# Patient Record
Sex: Male | Born: 1950 | Race: Black or African American | Hispanic: No | Marital: Married
Health system: Southern US, Community
[De-identification: ages and names within clinical notes are randomized; demographics above are authoritative.]

---

## 2013-07-30 ENCOUNTER — Inpatient Hospital Stay
Admission: AD | Admit: 2013-07-30 | Discharge: 2013-08-20 | Disposition: A | Discharge status: Skilled Nursing Facility | Payer: Self-pay | Source: Ambulatory Visit | Attending: Internal Medicine | Admitting: Internal Medicine

## 2013-07-31 ENCOUNTER — Other Ambulatory Visit (HOSPITAL_COMMUNITY): Payer: Self-pay

## 2013-07-31 LAB — COMPREHENSIVE METABOLIC PANEL
ALT: 19 U/L (ref 0–53)
Calcium: 9.4 mg/dL (ref 8.4–10.5)
GFR calc Af Amer: >90 mL/min (ref >90)
Glucose, Bld: 129 mg/dL — ABNORMAL HIGH (ref 70–99)
Sodium: 138 mEq/L (ref 135–145)
Total Protein: 6.1 g/dL (ref 6.0–8.3)

## 2013-07-31 LAB — HEMOGLOBIN A1C: Hgb A1c MFr Bld: 6.8 % — ABNORMAL HIGH (ref <5.7)

## 2013-07-31 LAB — PROCALCITONIN: Procalcitonin: 0.16 ng/mL

## 2013-07-31 LAB — PRO B NATRIURETIC PEPTIDE: Pro B Natriuretic peptide (BNP): 56.9 pg/mL (ref 0–125)

## 2013-08-02 LAB — CBC WITH DIFFERENTIAL/PLATELET
Hemoglobin: 12 g/dL — ABNORMAL LOW (ref 13.0–17.0)
Lymphocytes Relative: 4 % — ABNORMAL LOW (ref 12–46)
Lymphs Abs: 0.5 10*3/uL — ABNORMAL LOW (ref 0.7–4.0)
MCH: 30.3 pg (ref 26.0–34.0)
Monocytes Relative: 6 % (ref 3–12)
Neutro Abs: 10.4 10*3/uL — ABNORMAL HIGH (ref 1.7–7.7)
Neutrophils Relative %: 90 % — ABNORMAL HIGH (ref 43–77)
RBC: 3.96 MIL/uL — ABNORMAL LOW (ref 4.22–5.81)
WBC: 11.6 10*3/uL — ABNORMAL HIGH (ref 4.0–10.5)

## 2013-08-02 LAB — BASIC METABOLIC PANEL
BUN: 28 mg/dL — ABNORMAL HIGH (ref 6–23)
CO2: 32 mEq/L (ref 19–32)
Chloride: 97 mEq/L (ref 96–112)
Glucose, Bld: 151 mg/dL — ABNORMAL HIGH (ref 70–99)
Potassium: 4.6 mEq/L (ref 3.5–5.1)

## 2013-08-02 LAB — T4, FREE: Free T4: 1.2 ng/dL (ref 0.80–1.80)

## 2013-08-03 ENCOUNTER — Other Ambulatory Visit (HOSPITAL_COMMUNITY): Payer: Self-pay

## 2013-08-04 ENCOUNTER — Other Ambulatory Visit (HOSPITAL_COMMUNITY): Payer: Self-pay

## 2013-08-04 LAB — CBC WITH DIFFERENTIAL/PLATELET
Basophils Absolute: 0 10*3/uL (ref 0.0–0.1)
Basophils Relative: 0 % (ref 0–1)
Eosinophils Absolute: 0 10*3/uL (ref 0.0–0.7)
Eosinophils Absolute: 0 10*3/uL (ref 0.0–0.7)
Eosinophils Relative: 0 % (ref 0–5)
Hemoglobin: 11.5 g/dL — ABNORMAL LOW (ref 13.0–17.0)
Hemoglobin: 13 g/dL (ref 13.0–17.0)
Lymphocytes Relative: 6 % — ABNORMAL LOW (ref 12–46)
Lymphocytes Relative: 7 % — ABNORMAL LOW (ref 12–46)
Lymphs Abs: 0.7 10*3/uL (ref 0.7–4.0)
MCH: 30.1 pg (ref 26.0–34.0)
MCH: 31.1 pg (ref 26.0–34.0)
MCHC: 34.6 g/dL (ref 30.0–36.0)
MCV: 90.1 fL (ref 78.0–100.0)
Monocytes Absolute: 0.9 10*3/uL (ref 0.1–1.0)
Monocytes Relative: 4 % (ref 3–12)
Neutrophils Relative %: 87 % — ABNORMAL HIGH (ref 43–77)
Platelets: 182 10*3/uL (ref 150–400)
RBC: 3.82 MIL/uL — ABNORMAL LOW (ref 4.22–5.81)
RDW: 15.1 % (ref 11.5–15.5)
WBC: 11.7 10*3/uL — ABNORMAL HIGH (ref 4.0–10.5)

## 2013-08-04 LAB — COMPREHENSIVE METABOLIC PANEL
ALT: 27 U/L (ref 0–53)
Alkaline Phosphatase: 64 U/L (ref 39–117)
BUN: 27 mg/dL — ABNORMAL HIGH (ref 6–23)
CO2: 31 mEq/L (ref 19–32)
Calcium: 9.2 mg/dL (ref 8.4–10.5)
GFR calc Af Amer: >90 mL/min (ref >90)
GFR calc non Af Amer: >90 mL/min (ref >90)
Glucose, Bld: 144 mg/dL — ABNORMAL HIGH (ref 70–99)
Potassium: 5.1 mEq/L (ref 3.5–5.1)
Total Protein: 5.5 g/dL — ABNORMAL LOW (ref 6.0–8.3)

## 2013-08-04 LAB — PREALBUMIN: Prealbumin: 31.3 mg/dL (ref 17.0–34.0)

## 2013-08-05 LAB — CBC WITH DIFFERENTIAL/PLATELET
Basophils Absolute: 0 10*3/uL (ref 0.0–0.1)
Basophils Relative: 0 % (ref 0–1)
Eosinophils Absolute: 0 10*3/uL (ref 0.0–0.7)
Hemoglobin: 10.7 g/dL — ABNORMAL LOW (ref 13.0–17.0)
Lymphocytes Relative: 6 % — ABNORMAL LOW (ref 12–46)
MCH: 29.8 pg (ref 26.0–34.0)
MCHC: 33.1 g/dL (ref 30.0–36.0)
Monocytes Absolute: 0.7 10*3/uL (ref 0.1–1.0)
Neutrophils Relative %: 88 % — ABNORMAL HIGH (ref 43–77)
Platelets: 142 10*3/uL — ABNORMAL LOW (ref 150–400)
RDW: 14.9 % (ref 11.5–15.5)

## 2013-08-05 LAB — PROCALCITONIN: Procalcitonin: <0.1 ng/mL

## 2013-08-06 ENCOUNTER — Other Ambulatory Visit (HOSPITAL_COMMUNITY): Payer: Self-pay

## 2013-08-06 LAB — BLOOD GAS, ARTERIAL
Bicarbonate: 32.2 mEq/L — ABNORMAL HIGH (ref 20.0–24.0)
FIO2: 0.32 %
O2 Saturation: 96 %
Patient temperature: 98.6

## 2013-08-07 ENCOUNTER — Other Ambulatory Visit (HOSPITAL_COMMUNITY): Payer: Self-pay

## 2013-08-07 LAB — CBC WITH DIFFERENTIAL/PLATELET
Basophils Relative: 0 % (ref 0–1)
Eosinophils Absolute: 0 10*3/uL (ref 0.0–0.7)
Hemoglobin: 11.8 g/dL — ABNORMAL LOW (ref 13.0–17.0)
MCH: 29.7 pg (ref 26.0–34.0)
MCHC: 32.9 g/dL (ref 30.0–36.0)
Monocytes Absolute: 0.4 10*3/uL (ref 0.1–1.0)
Neutro Abs: 12.4 10*3/uL — ABNORMAL HIGH (ref 1.7–7.7)
Neutrophils Relative %: 92 % — ABNORMAL HIGH (ref 43–77)

## 2013-08-07 LAB — BASIC METABOLIC PANEL
BUN: 23 mg/dL (ref 6–23)
CO2: 33 mEq/L — ABNORMAL HIGH (ref 19–32)
Calcium: 9.1 mg/dL (ref 8.4–10.5)
Creatinine, Ser: 0.67 mg/dL (ref 0.50–1.35)
Glucose, Bld: 140 mg/dL — ABNORMAL HIGH (ref 70–99)

## 2013-08-08 ENCOUNTER — Other Ambulatory Visit (HOSPITAL_COMMUNITY): Payer: Self-pay

## 2013-08-08 LAB — BASIC METABOLIC PANEL
Calcium: 9 mg/dL (ref 8.4–10.5)
Calcium: 9.2 mg/dL (ref 8.4–10.5)
Creatinine, Ser: 0.71 mg/dL (ref 0.50–1.35)
Creatinine, Ser: 0.73 mg/dL (ref 0.50–1.35)
GFR calc Af Amer: >90 mL/min (ref >90)
GFR calc non Af Amer: >90 mL/min (ref >90)
Sodium: 133 mEq/L — ABNORMAL LOW (ref 135–145)

## 2013-08-08 LAB — CBC
MCH: 29.6 pg (ref 26.0–34.0)
MCHC: 32.7 g/dL (ref 30.0–36.0)
MCV: 90.6 fL (ref 78.0–100.0)
Platelets: 142 10*3/uL — ABNORMAL LOW (ref 150–400)
RDW: 15.1 % (ref 11.5–15.5)

## 2013-08-08 NOTE — H&P (Signed)
Lucas Glover is an 62 y.o. male.   Chief Complaint: Recent aspiration Malnutrition; dysphagia FTT; dementia Scheduled for percutaneous gastric tube placement Long term care Existing NG HPI: COPD- on steroids; Sz; debilitation  No past medical history on file.  No past surgical history on file.  No family history on file. Social History:  has no tobacco, alcohol, and drug history on file.  Allergies: Allergies not on file  No prescriptions prior to admission    Results for orders placed during the hospital encounter of 07/30/13 (from the past 48 hour(s))  BLOOD GAS, ARTERIAL     Status: Abnormal   Collection Time    08/06/13  4:20 PM      Result Value Range   FIO2 0.32     Delivery systems NASAL CANNULA     pH, Arterial 7.462 (*) 7.350 - 7.450   pCO2 arterial 45.7 (*) 35.0 - 45.0 mmHg   pO2, Arterial 74.1 (*) 80.0 - 100.0 mmHg   Bicarbonate 32.2 (*) 20.0 - 24.0 mEq/L   TCO2 33.6  0 - 100 mmol/L   Acid-Base Excess 8.1 (*) 0.0 - 2.0 mmol/L   O2 Saturation 96.0     Patient temperature 98.6     Collection site RIGHT RADIAL     Drawn by COLLECTED BY RT     Sample type ARTERIAL DRAW     Allens test (pass/fail) PASS  PASS  CBC WITH DIFFERENTIAL     Status: Abnormal   Collection Time    08/07/13  5:00 AM      Result Value Range   WBC 13.5 (*) 4.0 - 10.5 K/uL   RBC 3.97 (*) 4.22 - 5.81 MIL/uL   Hemoglobin 11.8 (*) 13.0 - 17.0 g/dL   HCT 16.1 (*) 09.6 - 04.5 %   MCV 90.4  78.0 - 100.0 fL   MCH 29.7  26.0 - 34.0 pg   MCHC 32.9  30.0 - 36.0 g/dL   RDW 40.9  81.1 - 91.4 %   Platelets 141 (*) 150 - 400 K/uL   Neutrophils Relative % 92 (*) 43 - 77 %   Lymphocytes Relative 5 (*) 12 - 46 %   Monocytes Relative 3  3 - 12 %   Eosinophils Relative 0  0 - 5 %   Basophils Relative 0  0 - 1 %   Neutro Abs 12.4 (*) 1.7 - 7.7 K/uL   Lymphs Abs 0.7  0.7 - 4.0 K/uL   Monocytes Absolute 0.4  0.1 - 1.0 K/uL   Eosinophils Absolute 0.0  0.0 - 0.7 K/uL   Basophils Absolute 0.0  0.0 - 0.1  K/uL   RBC Morphology RARE NRBCs     WBC Morphology MILD LEFT SHIFT (1-5% METAS, OCC MYELO, OCC BANDS)     Comment: TOXIC GRANULATION  BASIC METABOLIC PANEL     Status: Abnormal   Collection Time    08/07/13  5:00 AM      Result Value Range   Sodium 133 (*) 135 - 145 mEq/L   Potassium 4.8  3.5 - 5.1 mEq/L   Chloride 91 (*) 96 - 112 mEq/L   CO2 33 (*) 19 - 32 mEq/L   Glucose, Bld 140 (*) 70 - 99 mg/dL   BUN 23  6 - 23 mg/dL   Creatinine, Ser 7.82  0.50 - 1.35 mg/dL   Calcium 9.1  8.4 - 95.6 mg/dL   GFR calc non Af Amer >90  >90 mL/min   GFR  calc Af Amer >90  >90 mL/min   Comment: (NOTE)     The eGFR has been calculated using the CKD EPI equation.     This calculation has not been validated in all clinical situations.     eGFR's persistently <90 mL/min signify possible Chronic Kidney     Disease.  CBC     Status: Abnormal   Collection Time    08/08/13  5:26 AM      Result Value Range   WBC 13.3 (*) 4.0 - 10.5 K/uL   RBC 4.05 (*) 4.22 - 5.81 MIL/uL   Hemoglobin 12.0 (*) 13.0 - 17.0 g/dL   HCT 21.3 (*) 08.6 - 57.8 %   MCV 90.6  78.0 - 100.0 fL   MCH 29.6  26.0 - 34.0 pg   MCHC 32.7  30.0 - 36.0 g/dL   RDW 46.9  62.9 - 52.8 %   Platelets 142 (*) 150 - 400 K/uL  BASIC METABOLIC PANEL     Status: Abnormal   Collection Time    08/08/13  5:26 AM      Result Value Range   Sodium 132 (*) 135 - 145 mEq/L   Potassium 5.0  3.5 - 5.1 mEq/L   Chloride 91 (*) 96 - 112 mEq/L   CO2 33 (*) 19 - 32 mEq/L   Glucose, Bld 202 (*) 70 - 99 mg/dL   BUN 24 (*) 6 - 23 mg/dL   Creatinine, Ser 4.13  0.50 - 1.35 mg/dL   Calcium 9.0  8.4 - 24.4 mg/dL   GFR calc non Af Amer >90  >90 mL/min   GFR calc Af Amer >90  >90 mL/min   Comment: (NOTE)     The eGFR has been calculated using the CKD EPI equation.     This calculation has not been validated in all clinical situations.     eGFR's persistently <90 mL/min signify possible Chronic Kidney     Disease.   Dg Chest Port 1 View  08/07/2013    *RADIOLOGY REPORT*  Clinical Data: Respiratory failure, aspiration, pneumonia  PORTABLE CHEST - 1 VIEW  Comparison: 08/04/2013  Findings: Cardiomediastinal silhouette is stable.  NG tube in place is noted.  No segmental infiltrate or pulmonary edema.  Persistent streaky opacities in right upper lobe.  No pleural effusion.  IMPRESSION: No segmental infiltrate or pulmonary edema.  Persistent streaky opacities in the right upper lobe. NG tube in place.   Original Report Authenticated By: Natasha Mead, M.D.   Dg Chest Port 1 View  08/06/2013   *RADIOLOGY REPORT*  Clinical Data: Aspiration, evaluate NG tube placement  PORTABLE CHEST - 1 VIEW  Comparison: Chest radiograph 08/04/2013  Findings: Interval placement NG tube which extends into the gastric body.  Lungs are clear.  IMPRESSION: Interval placement NG tube in the stomach.   Original Report Authenticated By: Genevive Bi, M.D.   Dg Abd Portable 1v  08/06/2013   *RADIOLOGY REPORT*  Clinical Data: Panda placement  PORTABLE ABDOMEN - 1 VIEW  Comparison: Earlier film of the same day  Findings: Nasogastric tube extends to the gastric antrum as before. Normal bowel gas pattern.  Surgical clips in the left lower abdomen as before.  IMPRESSION:  Nasogastric tube to the gastric antrum.   Original Report Authenticated By: D. Andria Rhein, MD   Dg Abd Portable 1v  08/06/2013   *RADIOLOGY REPORT*  Clinical Data: Nasogastric tube placement.  PORTABLE ABDOMEN - 1 VIEW  Comparison: Chest radiographs same day.  Findings: 1627 hours.  Nasogastric tube projects into the distal stomach or proximal duodenum.  There is a small right upper quadrant calcification which may reflect a renal calculus or gallstone.  Scattered vascular calcifications and surgical clips are noted.  The bowel gas pattern appears normal.  IMPRESSION: Nasogastric tube tip in the right upper quadrant of the abdomen, likely in the distal stomach or proximal duodenum.  Possible gallstone or right kidney  stone.   Original Report Authenticated By: Carey Bullocks, M.D.    Review of Systems  Constitutional: Positive for weight loss. Negative for fever.  Respiratory: Positive for shortness of breath.   Cardiovascular: Negative for chest pain.  Gastrointestinal: Negative for nausea, vomiting and abdominal pain.  Neurological: Positive for weakness.  Psychiatric/Behavioral: Positive for memory loss.    There were no vitals taken for this visit. Physical Exam  Constitutional:  thin  Cardiovascular: Normal rate and regular rhythm.   Murmur heard. Respiratory: Effort normal. He has wheezes.  GI: Soft. Bowel sounds are normal. There is no tenderness.  Musculoskeletal: Normal range of motion.  FROM; weak  Neurological: He is alert.  Minimally confused  Skin: Skin is warm and dry.  Psychiatric:  Will consent family     Assessment/Plan Dysphagia; FTT; aspiration Scheduled for G tube 8/18 Wbc sl high - on steroids T: 99 Vanco ordered for prior to G tube Check kub and temp Attempted to consent with wife and dtr- NA Will try over weekend  [facility] A 08/08/2013, 2:56 PM

## 2013-08-09 LAB — BASIC METABOLIC PANEL
BUN: 26 mg/dL — ABNORMAL HIGH (ref 6–23)
CO2: 30 mEq/L (ref 19–32)
Calcium: 9 mg/dL (ref 8.4–10.5)
Calcium: 9 mg/dL (ref 8.4–10.5)
Creatinine, Ser: 0.73 mg/dL (ref 0.50–1.35)
GFR calc Af Amer: >90 mL/min (ref >90)
GFR calc non Af Amer: >90 mL/min (ref >90)
Glucose, Bld: 138 mg/dL — ABNORMAL HIGH (ref 70–99)
Potassium: 4.8 mEq/L (ref 3.5–5.1)
Sodium: 130 mEq/L — ABNORMAL LOW (ref 135–145)

## 2013-08-09 LAB — CBC
HCT: 36.3 % — ABNORMAL LOW (ref 39.0–52.0)
Hemoglobin: 12.5 g/dL — ABNORMAL LOW (ref 13.0–17.0)
MCH: 30.7 pg (ref 26.0–34.0)
MCV: 89.2 fL (ref 78.0–100.0)
RBC: 4.07 MIL/uL — ABNORMAL LOW (ref 4.22–5.81)

## 2013-08-10 LAB — BASIC METABOLIC PANEL
BUN: 25 mg/dL — ABNORMAL HIGH (ref 6–23)
Calcium: 9.3 mg/dL (ref 8.4–10.5)
GFR calc non Af Amer: >90 mL/min (ref >90)
Glucose, Bld: 132 mg/dL — ABNORMAL HIGH (ref 70–99)

## 2013-08-10 NOTE — Progress Notes (Signed)
Patient ID: Lucas Glover, male   DOB: 1951-06-23, 62 y.o.   MRN: [id number]   Consented wife via phone Consent in chart afeb Check kub in am Lovenox held

## 2013-08-11 ENCOUNTER — Other Ambulatory Visit (HOSPITAL_COMMUNITY): Payer: Self-pay

## 2013-08-11 LAB — CBC
Hemoglobin: 12.3 g/dL — ABNORMAL LOW (ref 13.0–17.0)
MCH: 30.1 pg (ref 26.0–34.0)
MCHC: 33.8 g/dL (ref 30.0–36.0)
MCV: 89 fL (ref 78.0–100.0)
Platelets: 151 10*3/uL (ref 150–400)

## 2013-08-11 LAB — BASIC METABOLIC PANEL
Calcium: 9.1 mg/dL (ref 8.4–10.5)
Creatinine, Ser: 0.79 mg/dL (ref 0.50–1.35)
GFR calc non Af Amer: >90 mL/min (ref >90)
Glucose, Bld: 142 mg/dL — ABNORMAL HIGH (ref 70–99)
Sodium: 130 mEq/L — ABNORMAL LOW (ref 135–145)

## 2013-08-11 MED ORDER — IOHEXOL 300 MG/ML  SOLN
50.0000 mL | Freq: Once | INTRAMUSCULAR | Status: AC | PRN
  Administered 2013-08-11: 20 mL via INTRAVENOUS

## 2013-08-11 MED ORDER — MIDAZOLAM HCL 2 MG/2ML IJ SOLN
INTRAMUSCULAR | Status: AC | PRN
  Administered 2013-08-11 (×3): 1 mg via INTRAVENOUS

## 2013-08-11 MED ORDER — FENTANYL CITRATE 0.05 MG/ML IJ SOLN
INTRAMUSCULAR | Status: AC | PRN
  Administered 2013-08-11 (×3): 50 ug via INTRAVENOUS

## 2013-08-11 NOTE — Procedures (Signed)
20 Fr Pull through Gastrostomy No comp

## 2013-08-11 NOTE — ED Notes (Signed)
Ancef 2g IV given

## 2013-08-12 LAB — BASIC METABOLIC PANEL
CO2: 28 mEq/L (ref 19–32)
Calcium: 9 mg/dL (ref 8.4–10.5)
Chloride: 95 mEq/L — ABNORMAL LOW (ref 96–112)
Creatinine, Ser: 0.78 mg/dL (ref 0.50–1.35)
GFR calc Af Amer: >90 mL/min (ref >90)
GFR calc non Af Amer: >90 mL/min (ref >90)
Glucose, Bld: 193 mg/dL — ABNORMAL HIGH (ref 70–99)
Sodium: 133 mEq/L — ABNORMAL LOW (ref 135–145)

## 2013-08-12 LAB — CBC
MCH: 30.7 pg (ref 26.0–34.0)
MCHC: 34.4 g/dL (ref 30.0–36.0)
Platelets: 153 10*3/uL (ref 150–400)
RBC: 3.74 MIL/uL — ABNORMAL LOW (ref 4.22–5.81)
RDW: 15.2 % (ref 11.5–15.5)

## 2013-08-13 LAB — BASIC METABOLIC PANEL
CO2: 28 mEq/L (ref 19–32)
Calcium: 8.8 mg/dL (ref 8.4–10.5)
Chloride: 96 mEq/L (ref 96–112)
Glucose, Bld: 192 mg/dL — ABNORMAL HIGH (ref 70–99)
Potassium: 4.5 mEq/L (ref 3.5–5.1)
Sodium: 134 mEq/L — ABNORMAL LOW (ref 135–145)

## 2013-08-13 LAB — URINALYSIS, ROUTINE W REFLEX MICROSCOPIC
Glucose, UA: NEGATIVE mg/dL
Hgb urine dipstick: NEGATIVE
Leukocytes, UA: NEGATIVE
Specific Gravity, Urine: 1.023 (ref 1.005–1.030)
pH: 5 (ref 5.0–8.0)

## 2013-08-13 LAB — CBC
Platelets: 155 10*3/uL (ref 150–400)
RBC: 3.53 MIL/uL — ABNORMAL LOW (ref 4.22–5.81)
RDW: 15.2 % (ref 11.5–15.5)
WBC: 11 10*3/uL — ABNORMAL HIGH (ref 4.0–10.5)

## 2013-08-14 ENCOUNTER — Other Ambulatory Visit (HOSPITAL_COMMUNITY): Payer: Self-pay

## 2013-08-14 LAB — URINE CULTURE
Colony Count: NO GROWTH
Culture: NO GROWTH

## 2013-08-14 LAB — BASIC METABOLIC PANEL
CO2: 31 mEq/L (ref 19–32)
Calcium: 8.9 mg/dL (ref 8.4–10.5)
Creatinine, Ser: 0.63 mg/dL (ref 0.50–1.35)
GFR calc non Af Amer: >90 mL/min (ref >90)
Glucose, Bld: 177 mg/dL — ABNORMAL HIGH (ref 70–99)
Sodium: 132 mEq/L — ABNORMAL LOW (ref 135–145)

## 2013-08-14 LAB — CBC
MCH: 30 pg (ref 26.0–34.0)
MCHC: 33.8 g/dL (ref 30.0–36.0)
MCV: 88.9 fL (ref 78.0–100.0)
Platelets: 154 10*3/uL (ref 150–400)

## 2013-08-15 LAB — BASIC METABOLIC PANEL
BUN: 20 mg/dL (ref 6–23)
Calcium: 8.8 mg/dL (ref 8.4–10.5)
GFR calc Af Amer: >90 mL/min (ref >90)
GFR calc non Af Amer: >90 mL/min (ref >90)
Glucose, Bld: 132 mg/dL — ABNORMAL HIGH (ref 70–99)
Potassium: 4.4 mEq/L (ref 3.5–5.1)
Sodium: 133 mEq/L — ABNORMAL LOW (ref 135–145)

## 2013-08-16 LAB — BASIC METABOLIC PANEL
Calcium: 8.8 mg/dL (ref 8.4–10.5)
GFR calc Af Amer: >90 mL/min (ref >90)
GFR calc non Af Amer: >90 mL/min (ref >90)
Glucose, Bld: 197 mg/dL — ABNORMAL HIGH (ref 70–99)
Potassium: 4.4 mEq/L (ref 3.5–5.1)
Sodium: 132 mEq/L — ABNORMAL LOW (ref 135–145)

## 2013-08-16 LAB — CBC
Hemoglobin: 10.8 g/dL — ABNORMAL LOW (ref 13.0–17.0)
MCH: 31.8 pg (ref 26.0–34.0)
MCHC: 35 g/dL (ref 30.0–36.0)
Platelets: 165 10*3/uL (ref 150–400)

## 2013-08-18 ENCOUNTER — Other Ambulatory Visit (HOSPITAL_COMMUNITY): Payer: Self-pay

## 2013-08-18 LAB — COMPREHENSIVE METABOLIC PANEL
ALT: 18 U/L (ref 0–53)
AST: 12 U/L (ref 0–37)
Alkaline Phosphatase: 63 U/L (ref 39–117)
CO2: 32 mEq/L (ref 19–32)
Calcium: 9.1 mg/dL (ref 8.4–10.5)
Chloride: 92 mEq/L — ABNORMAL LOW (ref 96–112)
GFR calc non Af Amer: >90 mL/min (ref >90)
Glucose, Bld: 107 mg/dL — ABNORMAL HIGH (ref 70–99)
Potassium: 4.3 mEq/L (ref 3.5–5.1)
Sodium: 131 mEq/L — ABNORMAL LOW (ref 135–145)

## 2013-08-18 LAB — CBC WITH DIFFERENTIAL/PLATELET
Basophils Absolute: 0 10*3/uL (ref 0.0–0.1)
Basophils Relative: 0 % (ref 0–1)
Eosinophils Relative: 0 % (ref 0–5)
Hemoglobin: 11.1 g/dL — ABNORMAL LOW (ref 13.0–17.0)
Lymphocytes Relative: 11 % — ABNORMAL LOW (ref 12–46)
Monocytes Absolute: 0.4 10*3/uL (ref 0.1–1.0)
Monocytes Relative: 4 % (ref 3–12)
Neutrophils Relative %: 85 % — ABNORMAL HIGH (ref 43–77)
Platelets: 164 10*3/uL (ref 150–400)
RBC: 3.64 MIL/uL — ABNORMAL LOW (ref 4.22–5.81)
WBC: 10.1 10*3/uL (ref 4.0–10.5)

## 2013-08-20 LAB — BASIC METABOLIC PANEL
BUN: 21 mg/dL (ref 6–23)
Creatinine, Ser: 0.59 mg/dL (ref 0.50–1.35)
GFR calc non Af Amer: >90 mL/min (ref >90)
Glucose, Bld: 121 mg/dL — ABNORMAL HIGH (ref 70–99)
Potassium: 4.2 mEq/L (ref 3.5–5.1)

## 2014-04-25 ENCOUNTER — Inpatient Hospital Stay
Admission: EM | Admit: 2014-04-25 | Discharge: 2014-05-25 | Disposition: A | Discharge status: Skilled Nursing Facility | Payer: Medicare Other | Source: Other Acute Inpatient Hospital | Attending: Internal Medicine | Admitting: Internal Medicine

## 2014-04-25 ENCOUNTER — Other Ambulatory Visit (HOSPITAL_COMMUNITY): Payer: Medicare Other

## 2014-04-25 DIAGNOSIS — J189 Pneumonia, unspecified organism: Secondary | ICD-10-CM

## 2014-04-25 DIAGNOSIS — Z930 Tracheostomy status: Secondary | ICD-10-CM

## 2014-04-25 DIAGNOSIS — I5033 Acute on chronic diastolic (congestive) heart failure: Secondary | ICD-10-CM

## 2014-04-25 DIAGNOSIS — J9620 Acute and chronic respiratory failure, unspecified whether with hypoxia or hypercapnia: Secondary | ICD-10-CM

## 2014-04-25 LAB — BLOOD GAS, ARTERIAL
Acid-Base Excess: 8.8 mmol/L — ABNORMAL HIGH (ref 0.0–2.0)
Bicarbonate: 33.1 mEq/L — ABNORMAL HIGH (ref 20.0–24.0)
FIO2: 0.3 %
LHR: 20 {breaths}/min
O2 Saturation: 98.1 %
PATIENT TEMPERATURE: 97.1
PEEP/CPAP: 5 cmH2O
PH ART: 7.46 — AB (ref 7.350–7.450)
TCO2: 34.6 mmol/L (ref 0–100)
VT: 450 mL
pCO2 arterial: 46.7 mmHg — ABNORMAL HIGH (ref 35.0–45.0)
pO2, Arterial: 91.1 mmHg (ref 80.0–100.0)

## 2014-04-25 MED ORDER — IOHEXOL 300 MG/ML  SOLN: 50.0000 mL | Freq: Once | INTRAMUSCULAR | Status: AC | PRN

## 2014-04-26 ENCOUNTER — Other Ambulatory Visit (HOSPITAL_COMMUNITY): Payer: Medicare Other

## 2014-04-26 LAB — COMPREHENSIVE METABOLIC PANEL
ALBUMIN: 2.6 g/dL — AB (ref 3.5–5.2)
ALK PHOS: 74 U/L (ref 39–117)
ALT: 12 U/L (ref 0–53)
AST: 12 U/L (ref 0–37)
BUN: 30 mg/dL — ABNORMAL HIGH (ref 6–23)
CO2: 32 mEq/L (ref 19–32)
Calcium: 8.8 mg/dL (ref 8.4–10.5)
Chloride: 99 mEq/L (ref 96–112)
Creatinine, Ser: 0.77 mg/dL (ref 0.50–1.35)
GFR calc Af Amer: >90 mL/min (ref >90)
GFR calc non Af Amer: >90 mL/min (ref >90)
GLUCOSE: 132 mg/dL — AB (ref 70–99)
Potassium: 4.8 mEq/L (ref 3.7–5.3)
SODIUM: 140 meq/L (ref 137–147)
TOTAL PROTEIN: 5.5 g/dL — AB (ref 6.0–8.3)
Total Bilirubin: <0.2 mg/dL — ABNORMAL LOW (ref 0.3–1.2)

## 2014-04-26 LAB — CBC
HCT: 25.5 % — ABNORMAL LOW (ref 39.0–52.0)
HEMOGLOBIN: 8.2 g/dL — AB (ref 13.0–17.0)
MCH: 30 pg (ref 26.0–34.0)
MCHC: 32.2 g/dL (ref 30.0–36.0)
MCV: 93.4 fL (ref 78.0–100.0)
Platelets: 212 10*3/uL (ref 150–400)
RBC: 2.73 MIL/uL — ABNORMAL LOW (ref 4.22–5.81)
RDW: 14.7 % (ref 11.5–15.5)
WBC: 13.4 10*3/uL — ABNORMAL HIGH (ref 4.0–10.5)

## 2014-04-26 LAB — TSH: TSH: 0.135 u[IU]/mL — ABNORMAL LOW (ref 0.350–4.500)

## 2014-04-26 NOTE — Progress Notes (Signed)
[facility]                                                                                              Progress note     Patient Demographics  Lucas Glover, is a 63 y.o. male  UJW:[id number]  FAO:[id number]  DOB - [birth year]  Admit date - 04/25/2014  Admitting Physician Carron CurieAli Yazleemar Strassner, MD  Outpatient Primary MD for the patient is PROVIDER NOT IN SYSTEM  LOS - 1   No chief complaint on file.          Subjective:   Lucas Glover more alert and awake. Denies any chest pains or shortness of breath  Objective:   Vital signs  Temperature 97.6 Heart rate 97 Respiratory rate 21 Blood pressure 119/70 Pulse ox 100%    Exam Awake Alert, confused, No new F.N deficits, [place].AT,PERRAL Supple Neck,No JVD, No cervical lymphadenopathy appreciated.  Tracheostomy midline Symmetrical Chest wall movement, decreased breath sounds bilaterally,  RRR,No Gallops,Rubs or new Murmurs, No Parasternal Heave +ve B.Sounds, Abd Soft, Non tender, No organomegaly appriciated, No rebound - guarding or rigidity. PEG tube noted No Cyanosis, Clubbing or edema, No new Rash or bruise  /left   I&Os 970/470 Peg Tube Foley - yes Trach yes  Data Review   CBC  Recent Labs Lab 04/26/14 0634  WBC 13.4*  HGB 8.2*  HCT 25.5*  PLT 212  MCV 93.4  MCH 30.0  MCHC 32.2  RDW 14.7    Chemistries   Recent Labs Lab 04/26/14 0634  NA 140  K 4.8  CL 99  CO2 32  GLUCOSE 132*  BUN 30*  CREATININE 0.77  CALCIUM 8.8  AST 12  ALT 12  ALKPHOS 74  BILITOT <0.2*   ------------------------------------------------------------------------------------------------------------------ CrCl is unknown because there is no height on file for the current visit. ------------------------------------------------------------------------------------------------------------------ No results found for this basename: HGBA1C,  in the  last 72 hours ------------------------------------------------------------------------------------------------------------------ No results found for this basename: CHOL, HDL, LDLCALC, TRIG, CHOLHDL, LDLDIRECT,  in the last 72 hours ------------------------------------------------------------------------------------------------------------------  Recent Labs  04/26/14 0634  TSH 0.135*   ------------------------------------------------------------------------------------------------------------------ No results found for this basename: VITAMINB12, FOLATE, FERRITIN, TIBC, IRON, RETICCTPCT,  in the last 72 hours  Coagulation profile No results found for this basename: INR, PROTIME,  in the last 168 hours  No results found for this basename: DDIMER,  in the last 72 hours  Cardiac Enzymes No results found for this basename: CK, CKMB, TROPONINI, MYOGLOBIN,  in the last 168 hours ------------------------------------------------------------------------------------------------------------------ No components found with this basename: POCBNP,   Micro Results No results found for this or any previous visit (from the past 240 hour(s)).     Assessment & Plan    Respiratory failure, ventilator dependent; obstructive sleep apnea/COPD-start weaning per protocol Mersa pneumonia on IV vancomycin Seizures on Keppra C. difficile on IV Flagyl Hypertension controlled Dementia History of abdominal aortic aneurysm-chronic Anemia Depression Routine calorie malnutrition on Vital Renal mass follow as outpatient Generalized weakness; PT OT as tolerated Low TSH probably sick euthyroid syndrome; check T3-T4  Code Status: Full   DVT Prophylaxis  Lovenox  Carron CurieAli Reese Stockman M.D on 04/26/2014 at 3:08 PM

## 2014-04-27 DIAGNOSIS — Z930 Tracheostomy status: Secondary | ICD-10-CM

## 2014-04-27 DIAGNOSIS — J9620 Acute and chronic respiratory failure, unspecified whether with hypoxia or hypercapnia: Secondary | ICD-10-CM

## 2014-04-27 DIAGNOSIS — J189 Pneumonia, unspecified organism: Secondary | ICD-10-CM

## 2014-04-27 LAB — T3: T3, Total: 53.6 ng/dl — ABNORMAL LOW (ref 80.0–204.0)

## 2014-04-27 LAB — T4, FREE: Free T4: 1.67 ng/dL (ref 0.80–1.80)

## 2014-04-27 LAB — VANCOMYCIN, TROUGH: Vancomycin Tr: 20 ug/mL (ref 10.0–20.0)

## 2014-04-27 NOTE — Consult Note (Signed)
Name: Lucas Glover MRN: [id number] DOB: 11-29-1951    ADMISSION DATE:  04/25/2014 CONSULTATION DATE:  5/4  REFERRING MD :  hijazi PRIMARY SERVICE:  ssh  CHIEF COMPLAINT:  Vent weaning   BRIEF PATIENT DESCRIPTION:  This is [facility] 63 [facility]. Has known h/o seizure d/o and COPD. [facility]. Supportive interventions included: mechanical ventilation and escalation of his AED regimen. His hospital course was c/b MRSA tracheobronchitis/ HCAP, also MRSA bacteremia, AECOPD, and C diff infection. He was extubated initially but required re-intubation for recurrent respiratory distress & ultimately required trach and PEG. [facility] PCCM asked to see to assist w/ weaning efforts.   SIGNIFICANT EVENTS / STUDIES   CULTURES:   HISTORY OF PRESENT ILLNESS:   See above   PAST MEDICAL HISTORY :  COPD Seizure disorder  OSA was on CPAP  HTN GERD Dyslipidemia  Type 2 DM  BPH S/p aneurysm repair Recent hospital problem list: MRSA PNA, recurrent respiratory failure, status epilepticus, MRSA bacteremia, right kidney mass   Prior to Admission medications   Seroquel, duoneb, formoterol, pulmicort, solumedrol, levemir, ssi, protonix, lmwh, keppra, scopolamine, flagyl, ativan (PRN), vanc    Allergies  Allergen Reactions  . Aspirin and iodine      FAMILY HISTORY:  No family history on file. SOCIAL HISTORY:  has no tobacco, alcohol, and drug history on file.  REVIEW OF SYSTEMS:   Unable   SUBJECTIVE:  Appears comfortable. Restless and impulsive at times  VITAL SIGNS:  reviewed   PHYSICAL EXAMINATION: General:  63 year old male, in no acute distress on PSV Neuro:  Awake, able to stand w/ minimal support. F/C, trys to communicate. Impulsive at times  HEENT:  Trach unremarkable  Cardiovascular:  rrr Lungs:  Scattered rhonchi  Abdomen:  Soft,  non-tender PEG unremarkable  Musculoskeletal:  Intact  Skin:  Intact    Recent Labs Lab 04/26/14 0634  NA 140  K 4.8  CL 99  CO2 32  BUN 30*  CREATININE 0.77  GLUCOSE 132*    Recent Labs Lab 04/26/14 0634  HGB 8.2*  HCT 25.5*  WBC 13.4*  PLT 212   Dg Chest Port 1 View  04/26/2014   CLINICAL DATA:  Respiratory failure.  EXAM: PORTABLE CHEST - 1 VIEW  COMPARISON:  08/18/2013  FINDINGS: The cardiomediastinal silhouette is unremarkable.  A tracheostomy tube is now noted.  A left PICC line with tip overlying the lower SVC is present.  There is no evidence of focal airspace disease, pulmonary edema, suspicious pulmonary nodule/mass, pleural effusion, or pneumothorax. No acute bony abnormalities are identified.  IMPRESSION: Support apparatus as described without evidence of acute cardiopulmonary disease.   Electronically Signed   By: Laveda AbbeJeff  Hu M.D.   On: 04/26/2014 11:59   Dg Abd Portable 1v  04/25/2014   CLINICAL DATA:  PEG placement.  Gastrografin.  EXAM: PORTABLE ABDOMEN - 1 VIEW  COMPARISON:  SP GASTROSTOMY TUBE INSERT PERCUT W/FL dated 08/11/2013; DG ABD PORTABLE 1V dated 08/11/2013  FINDINGS: Contrast material is demonstrated in the gastrostomy tube and in the stomach consistent with gastric location of the tube. No extravasation is demonstrated. Contrast material is noted in the duodenum suggesting no evidence of gastric outlet obstruction. Bowel gas pattern is unremarkable.  IMPRESSION: Contrast material in the stomach suggests gastric location of gastrostomy tube.   Electronically Signed  By: Burman NievesWilliam  Stevens M.D.   On: 04/25/2014 22:48    ASSESSMENT / PLAN:  Acute respiratory failure s/p seizure c/b [facility] AECOPD: improving MRSA bacteremia ([facility] Tracheostomy status  Seizure d/o Encephalopathy ? Element of underlying dementia  C-diff colitis  HTN Protein calorie malnutrition Deconditioning  Anemia of critical illness Low TSH. Likely sick  euthyroid: thyroid studies pending 4/5 Depression   Discussion His CXR is clear. Looks like he is tolerating PSV well, and could possibly be able to tolerate ATC soon if not now. He should be able to come off the vent. Not sure he will be a candidate for decannulation  Plan -cont weaning efforts w/ hope to transition to ATC trials soon -vanc (for MRSA PNA and bacteremia) w/ plan to d/c 5/9 -cont flagyl for C-diff (plan to d/c 5/9) -cont nutritional support -cont rehab efforts -taper steroids (this may help be making delirium worse)  -cont BDs   We will see again on Thursday   Patient seen and examined, agree with above note.  I dictated the care and orders written for this patient under my direction.  Alyson ReedyWesam G Yacoub, MD [phone]

## 2014-04-29 LAB — BLOOD GAS, ARTERIAL
Acid-Base Excess: 8.8 mmol/L — ABNORMAL HIGH (ref 0.0–2.0)
Bicarbonate: 33.1 mEq/L — ABNORMAL HIGH (ref 20.0–24.0)
FIO2: 0.28 %
O2 Saturation: 95.6 %
PCO2 ART: 48.2 mmHg — AB (ref 35.0–45.0)
PEEP: 5 cmH2O
PO2 ART: 76.8 mmHg — AB (ref 80.0–100.0)
Patient temperature: 98.6
Pressure support: 12 cmH2O
TCO2: 34.6 mmol/L (ref 0–100)
pH, Arterial: 7.452 — ABNORMAL HIGH (ref 7.350–7.450)

## 2014-04-29 LAB — CBC WITH DIFFERENTIAL/PLATELET
BASOS ABS: 0 10*3/uL (ref 0.0–0.1)
Basophils Relative: 0 % (ref 0–1)
EOS PCT: 1 % (ref 0–5)
Eosinophils Absolute: 0.1 10*3/uL (ref 0.0–0.7)
HCT: 28 % — ABNORMAL LOW (ref 39.0–52.0)
Hemoglobin: 9 g/dL — ABNORMAL LOW (ref 13.0–17.0)
Lymphocytes Relative: 14 % (ref 12–46)
Lymphs Abs: 1.2 10*3/uL (ref 0.7–4.0)
MCH: 30.2 pg (ref 26.0–34.0)
MCHC: 32.1 g/dL (ref 30.0–36.0)
MCV: 94 fL (ref 78.0–100.0)
MONO ABS: 1 10*3/uL (ref 0.1–1.0)
Monocytes Relative: 11 % (ref 3–12)
Neutro Abs: 6.8 10*3/uL (ref 1.7–7.7)
Neutrophils Relative %: 74 % (ref 43–77)
Platelets: 211 10*3/uL (ref 150–400)
RBC: 2.98 MIL/uL — ABNORMAL LOW (ref 4.22–5.81)
RDW: 14.4 % (ref 11.5–15.5)
WBC: 9.2 10*3/uL (ref 4.0–10.5)

## 2014-04-29 LAB — BASIC METABOLIC PANEL
BUN: 17 mg/dL (ref 6–23)
CALCIUM: 8.7 mg/dL (ref 8.4–10.5)
CO2: 33 meq/L — AB (ref 19–32)
CREATININE: 0.66 mg/dL (ref 0.50–1.35)
Chloride: 100 mEq/L (ref 96–112)
GFR calc Af Amer: >90 mL/min (ref >90)
GFR calc non Af Amer: >90 mL/min (ref >90)
GLUCOSE: 143 mg/dL — AB (ref 70–99)
Potassium: 4.3 mEq/L (ref 3.7–5.3)
Sodium: 140 mEq/L (ref 137–147)

## 2014-04-29 LAB — VANCOMYCIN, TROUGH: Vancomycin Tr: 16.2 ug/mL (ref 10.0–20.0)

## 2014-04-30 NOTE — Progress Notes (Signed)
   Name: Lucas Glover MRN: [id number] DOB: 1951-06-23    ADMISSION DATE:  04/25/2014 CONSULTATION DATE:  5/4  REFERRING MD :  hijazi PRIMARY SERVICE:  ssh  CHIEF COMPLAINT:  Vent weaning   BRIEF PATIENT DESCRIPTION:  This is [facility] 63 [facility]. Has known h/o seizure d/o and COPD. [facility]. Supportive interventions included: mechanical ventilation and escalation of his AED regimen. His hospital course was c/b MRSA tracheobronchitis/ HCAP, also MRSA bacteremia, AECOPD, and C diff infection. He was extubated initially but required re-intubation for recurrent respiratory distress & ultimately required trach and PEG. [facility] PCCM asked to see to assist w/ weaning efforts.   SIGNIFICANT EVENTS / STUDIES   CULTURES:   HISTORY OF PRESENT ILLNESS:   See above   PAST MEDICAL HISTORY :  COPD Seizure disorder  OSA was on CPAP  HTN GERD Dyslipidemia  Type 2 DM  BPH S/p aneurysm repair Recent hospital problem list: MRSA PNA, recurrent respiratory failure, status epilepticus, MRSA bacteremia, right kidney mass   Prior to Admission medications   Seroquel, duoneb, formoterol, pulmicort, solumedrol, levemir, ssi, protonix, lmwh, keppra, scopolamine, flagyl, ativan (PRN), vanc    Allergies  Allergen Reactions  . Aspirin and iodine      FAMILY HISTORY:  No family history on file. SOCIAL HISTORY:  has no tobacco, alcohol, and drug history on file.  REVIEW OF SYSTEMS:   Unable   SUBJECTIVE:  Appears comfortable. Doing great w/ ATC trials  VITAL SIGNS:  reviewed   PHYSICAL EXAMINATION: General:  63 year old male, in no acute distress on ATC Neuro:  Awake, follows commands  HEENT:  Trach unremarkable  Cardiovascular:  rrr Lungs:  occ rhonchi, resp unlabored.  Abdomen:  Soft, non-tender PEG unremarkable  Musculoskeletal:  Intact    Skin:  Intact    Recent Labs Lab 04/26/14 0634 04/29/14 0545  NA 140 140  K 4.8 4.3  CL 99 100  CO2 32 33*  BUN 30* 17  CREATININE 0.77 0.66  GLUCOSE 132* 143*    Recent Labs Lab 04/26/14 0634 04/29/14 0545  HGB 8.2* 9.0*  HCT 25.5* 28.0*  WBC 13.4* 9.2  PLT 212 211   No results found.  ASSESSMENT / PLAN:  Acute respiratory failure s/p seizure c/b [facility] P[facility] AECOPD: improving MRSA bacteremia ([facility]) Tracheostomy status  Seizure d/o Encephalopathy ? Element of underlying dementia  C-diff colitis  HTN Protein calorie malnutrition Deconditioning  Anemia of critical illness Low TSH. Likely sick euthyroid: thyroid studies pending 4/5 Depression   Discussion He is doing great on ATC. Now on 28%. Should be ready for SLP eval. Will need him to be much stronger before he can be considered for decannulation but may be a candidate. 5/7  Plan -d/c vent from room -cont routine trach care. Think he is ready for SLP eval. Can consider change to cuffless trach on rounds Monday  -vanc (for MRSA PNA and bacteremia) w/ plan to d/c 5/9 -cont flagyl for C-diff (plan to d/c 5/9) -cont nutritional support -cont rehab efforts -taper steroids (this may help be making delirium worse)  -cont BDs  Patient seen and examined, agree with above note.  I dictated the care and orders written for this patient under my direction.  Alyson ReedyWesam G Yacoub, MD [phone]

## 2014-05-02 ENCOUNTER — Other Ambulatory Visit (HOSPITAL_COMMUNITY): Payer: Medicare Other

## 2014-05-02 LAB — CBC WITH DIFFERENTIAL/PLATELET
BASOS ABS: 0 10*3/uL (ref 0.0–0.1)
Basophils Relative: 0 % (ref 0–1)
Eosinophils Absolute: 0.3 10*3/uL (ref 0.0–0.7)
Eosinophils Relative: 3 % (ref 0–5)
HEMATOCRIT: 31.1 % — AB (ref 39.0–52.0)
Hemoglobin: 9.5 g/dL — ABNORMAL LOW (ref 13.0–17.0)
LYMPHS PCT: 8 % — AB (ref 12–46)
Lymphs Abs: 0.9 10*3/uL (ref 0.7–4.0)
MCH: 29.5 pg (ref 26.0–34.0)
MCHC: 30.5 g/dL (ref 30.0–36.0)
MCV: 96.6 fL (ref 78.0–100.0)
Monocytes Absolute: 0.7 10*3/uL (ref 0.1–1.0)
Monocytes Relative: 7 % (ref 3–12)
NEUTROS ABS: 8.7 10*3/uL — AB (ref 1.7–7.7)
Neutrophils Relative %: 82 % — ABNORMAL HIGH (ref 43–77)
PLATELETS: 217 10*3/uL (ref 150–400)
RBC: 3.22 MIL/uL — ABNORMAL LOW (ref 4.22–5.81)
RDW: 14.5 % (ref 11.5–15.5)
WBC: 10.6 10*3/uL — AB (ref 4.0–10.5)

## 2014-05-02 LAB — URINE MICROSCOPIC-ADD ON

## 2014-05-02 LAB — BLOOD GAS, ARTERIAL
Acid-Base Excess: 9.5 mmol/L — ABNORMAL HIGH (ref 0.0–2.0)
BICARBONATE: 34.5 meq/L — AB (ref 20.0–24.0)
FIO2: 0.28 %
O2 SAT: 96.2 %
PATIENT TEMPERATURE: 98.6
PH ART: 7.402 (ref 7.350–7.450)
PO2 ART: 83.3 mmHg (ref 80.0–100.0)
TCO2: 36.3 mmol/L (ref 0–100)
pCO2 arterial: 56.6 mmHg — ABNORMAL HIGH (ref 35.0–45.0)

## 2014-05-02 LAB — CK TOTAL AND CKMB (NOT AT ARMC)
CK, MB: 1.3 ng/mL (ref 0.3–4.0)
Relative Index: INVALID (ref 0.0–2.5)
Total CK: 52 U/L (ref 7–232)

## 2014-05-02 LAB — BASIC METABOLIC PANEL
BUN: 17 mg/dL (ref 6–23)
CHLORIDE: 98 meq/L (ref 96–112)
CO2: 34 mEq/L — ABNORMAL HIGH (ref 19–32)
CREATININE: 0.74 mg/dL (ref 0.50–1.35)
Calcium: 8.7 mg/dL (ref 8.4–10.5)
GFR calc non Af Amer: >90 mL/min (ref >90)
Glucose, Bld: 141 mg/dL — ABNORMAL HIGH (ref 70–99)
Potassium: 4.4 mEq/L (ref 3.7–5.3)
Sodium: 141 mEq/L (ref 137–147)

## 2014-05-02 LAB — URINALYSIS, ROUTINE W REFLEX MICROSCOPIC
BILIRUBIN URINE: NEGATIVE
GLUCOSE, UA: NEGATIVE mg/dL
Hgb urine dipstick: NEGATIVE
Ketones, ur: NEGATIVE mg/dL
Nitrite: NEGATIVE
PROTEIN: NEGATIVE mg/dL
Specific Gravity, Urine: 1.026 (ref 1.005–1.030)
Urobilinogen, UA: 0.2 mg/dL (ref 0.0–1.0)
pH: 5.5 (ref 5.0–8.0)

## 2014-05-02 LAB — TROPONIN I: Troponin I: <0.3 ng/mL (ref <0.30)

## 2014-05-02 LAB — PRO B NATRIURETIC PEPTIDE: Pro B Natriuretic peptide (BNP): 98.9 pg/mL (ref 0–125)

## 2014-05-03 LAB — CK TOTAL AND CKMB (NOT AT ARMC)
CK, MB: 1.3 ng/mL (ref 0.3–4.0)
Relative Index: INVALID (ref 0.0–2.5)
Total CK: 55 U/L (ref 7–232)

## 2014-05-04 LAB — URINE CULTURE
Colony Count: NO GROWTH
Culture: NO GROWTH

## 2014-05-04 LAB — VANCOMYCIN, TROUGH: Vancomycin Tr: 17.5 ug/mL (ref 10.0–20.0)

## 2014-05-04 NOTE — Progress Notes (Signed)
[facility]                                                                                              Progress note     Patient Demographics  Lucas Glover, is a 63 y.o. male  WGN:[id number]  HQI:[id number]  DOB - 1951/11/23  Admit date - 04/25/2014  Admitting Physician Carron CurieAli Priscilla Kirstein, MD  Outpatient Primary MD for the patient is PROVIDER NOT IN SYSTEM  LOS - 9   No chief complaint on file.          Subjective:   Lucas Glover cannot give any history at this time  Objective:   Vital signs  Temperature 98.1 Heart rate 80 Respiratory rate 21 Blood pressure 104/67 Pulse ox 100%    Exam Awake Alert, confused, No new F.N deficits, [place].AT,PERRAL Supple Neck,No JVD, No cervical lymphadenopathy appreciated.  Tracheostomy midline Symmetrical Chest wall movement, decreased breath sounds bilaterally,  RRR,No Gallops,Rubs or new Murmurs, No Parasternal Heave +ve B.Sounds, Abd Soft, Non tender, No organomegaly appriciated, No rebound - guarding or rigidity. PEG tube noted No Cyanosis, Clubbing or edema, No new Rash or bruise  /left   I&Os 1962/1530 Peg Tube Foley - yes Trach yes  Data Review   CBC  Recent Labs Lab 04/29/14 0545 05/02/14 1130  WBC 9.2 10.6*  HGB 9.0* 9.5*  HCT 28.0* 31.1*  PLT 211 217  MCV 94.0 96.6  MCH 30.2 29.5  MCHC 32.1 30.5  RDW 14.4 14.5  LYMPHSABS 1.2 0.9  MONOABS 1.0 0.7  EOSABS 0.1 0.3  BASOSABS 0.0 0.0    Chemistries   Recent Labs Lab 04/29/14 0545 05/02/14 1130  NA 140 141  K 4.3 4.4  CL 100 98  CO2 33* 34*  GLUCOSE 143* 141*  BUN 17 17  CREATININE 0.66 0.74  CALCIUM 8.7 8.7   ------------------------------------------------------------------------------------------------------------------ CrCl is unknown because there is no height on file for the current  visit. ------------------------------------------------------------------------------------------------------------------ No results found for this basename: HGBA1C,  in the last 72 hours ------------------------------------------------------------------------------------------------------------------ No results found for this basename: CHOL, HDL, LDLCALC, TRIG, CHOLHDL, LDLDIRECT,  in the last 72 hours ------------------------------------------------------------------------------------------------------------------ No results found for this basename: TSH, T4TOTAL, FREET3, T3FREE, THYROIDAB,  in the last 72 hours ------------------------------------------------------------------------------------------------------------------ No results found for this basename: VITAMINB12, FOLATE, FERRITIN, TIBC, IRON, RETICCTPCT,  in the last 72 hours  Coagulation profile No results found for this basename: INR, PROTIME,  in the last 168 hours  No results found for this basename: DDIMER,  in the last 72 hours  Cardiac Enzymes  Recent Labs Lab 05/02/14 1130 05/03/14 1600  CKMB 1.3 1.3  TROPONINI <0.30  --    ------------------------------------------------------------------------------------------------------------------ No components found with this basename: POCBNP,   Micro Results Recent Results (from the past 240 hour(s))  URINE CULTURE     Status: None   Collection Time    05/02/14 10:11 AM      Result Value Ref Range Status   Specimen Description URINE, RANDOM   Final   Special Requests NONE   Final   Culture  Setup Time  Final   Value: 05/02/2014 17:54     [facility]formed at [facility]   Colony Count     Final   Value: NO GROWTH     Performed at [facility]   Culture     Final   Value: NO GROWTH     Performed at [facility]   Report Status 05/04/2014 FINAL   Final  CULTURE, BLOOD (ROUTINE X 2)     Status: None   Collection Time    05/02/14 12:00 PM       Result Value Ref Range Status   Specimen Description BLOOD PICC LINE   Final   Special Requests BOTTLES DRAWN AEROBIC AND ANAEROBIC 10CC   Final   Culture  Setup Time     Final   Value: 05/02/2014 17:38     Performed at [facility]   Culture     Final   Value:        BLOOD CULTURE RECEIVED NO GROWTH TO DATE CULTURE WILL BE HELD FOR 5 DAYS BEFORE ISSUING A FINAL NEGATIVE REPORT     Performed at [facility]   Report Status PENDING   Incomplete       Assessment & Plan    Respiratory failure, ventilator dependent; obstructive sleep apnea/COPD-had a setback yesterday continue with ATC    trials. Mersa pneumonia on IV vancomycin Seizures on Keppra C. difficile on by mouth Flagyl Hypertension controlled Dementia History of abdominal aortic aneurysm-chronic Anemia Depression Routine calorie malnutrition on Vital Renal mass follow as outpatient Generalized weakness; PT OT as tolerated Low TSH probably sick euthyroid syndrome; Plan Check labs in a.m.  Code Status: Full   DVT Prophylaxis  Lovenox   Carron CurieAli Othniel Maret M.D on 05/04/2014 at 10:12 AM

## 2014-05-04 NOTE — Progress Notes (Signed)
   Name: Lucas Glover MRN: [id number] DOB: [birth year]    ADMISSION DATE:  04/25/2014 CONSULTATION DATE:  5/4  REFERRING MD :  hijazi PRIMARY SERVICE:  ssh  CHIEF COMPLAINT:  Vent weaning   BRIEF PATIENT DESCRIPTION:  This is a 63 year old male [facility] for weaning efforts. Has known h/o seizure d/o and COPD. Was recently [facility] SNF [facility] for status epilepticus. Supportive interventions included: mechanical ventilation and escalation of his AED regimen. His hospital course was c/b MRSA tracheobronchitis/ HCAP, also MRSA bacteremia, AECOPD, and C diff infection. He was extubated initially but required re-intubation for recurrent respiratory distress & ultimately required trach and PEG. [facility]. PCCM asked to see to assist w/ weaning efforts.   SIGNIFICANT EVENTS / STUDIES   CULTURES:   HISTORY OF PRESENT ILLNESS:   See above   PAST MEDICAL HISTORY :  COPD Seizure disorder  OSA was on CPAP  HTN GERD Dyslipidemia  Type 2 DM  BPH S/p aneurysm repair Recent hospital problem list: MRSA PNA, recurrent respiratory failure, status epilepticus, MRSA bacteremia, right kidney mass   SUBJECTIVE:  Fatigued back on vent   VITAL SIGNS:  reviewed   PHYSICAL EXAMINATION: General:  63 year old male, now back on vent  Neuro:  Awake, follows commands  HEENT:  Trach unremarkable  Cardiovascular:  rrr Lungs:  occ rhonchi, resp unlabored.  Abdomen:  Soft, non-tender PEG unremarkable  Musculoskeletal:  Intact  Skin:  Intact    Recent Labs Lab 04/29/14 0545 05/02/14 1130  NA 140 141  K 4.3 4.4  CL 100 98  CO2 33* 34*  BUN 17 17  CREATININE 0.66 0.74  GLUCOSE 143* 141*    Recent Labs Lab 04/29/14 0545 05/02/14 1130  HGB 9.0* 9.5*  HCT 28.0* 31.1*  WBC 9.2 10.6*  PLT 211 217   No results found.  ASSESSMENT / PLAN:  Acute respiratory failure s/p seizure c/b MRSA PNA ([facility]) AECOPD:  improving MRSA bacteremia ([facility]) Tracheostomy status  Seizure d/o Encephalopathy ? Element of underlying dementia  C-diff colitis  HTN Protein calorie malnutrition Deconditioning  Anemia of critical illness Low TSH. Likely sick euthyroid: thyroid studies pending 4/5 Depression   Discussion Fatigued easily/ tried ATC. Now back on vent   Plan -vent wean protocol/ cont routine trach care. Plan will be trach colar 4-6 hr tues -cont nutritional support -cont rehab efforts -taper steroids (this may help be making delirium worse)  -cont BDs -no downsize -pcxr does not exaplian failed wean, may nee angio chest   Simonne MartinetPeter E Babcock, NP  I have fully examined this patient and agree with above findings.    And edite dinfull  Mcarthur Rossettianiel J. Tyson AliasFeinstein, MD, FACP Pgr: [phone] [place] [facility]

## 2014-05-05 ENCOUNTER — Other Ambulatory Visit (HOSPITAL_COMMUNITY): Payer: Medicare Other

## 2014-05-05 LAB — CBC
HCT: 26.8 % — ABNORMAL LOW (ref 39.0–52.0)
HEMOGLOBIN: 8.2 g/dL — AB (ref 13.0–17.0)
MCH: 28.7 pg (ref 26.0–34.0)
MCHC: 30.6 g/dL (ref 30.0–36.0)
MCV: 93.7 fL (ref 78.0–100.0)
PLATELETS: 203 10*3/uL (ref 150–400)
RBC: 2.86 MIL/uL — AB (ref 4.22–5.81)
RDW: 14.4 % (ref 11.5–15.5)
WBC: 10.8 10*3/uL — ABNORMAL HIGH (ref 4.0–10.5)

## 2014-05-05 LAB — BASIC METABOLIC PANEL
BUN: 17 mg/dL (ref 6–23)
CHLORIDE: 99 meq/L (ref 96–112)
CO2: 32 mEq/L (ref 19–32)
Calcium: 8.7 mg/dL (ref 8.4–10.5)
Creatinine, Ser: 0.88 mg/dL (ref 0.50–1.35)
GFR calc Af Amer: >90 mL/min (ref >90)
GFR calc non Af Amer: 89 mL/min — ABNORMAL LOW (ref >90)
Glucose, Bld: 90 mg/dL (ref 70–99)
POTASSIUM: 3.8 meq/L (ref 3.7–5.3)
SODIUM: 140 meq/L (ref 137–147)

## 2014-05-05 NOTE — Progress Notes (Signed)
[facility]                                                                                              Progress note     Patient Demographics  Lucas Glover, is a 63 y.o. male  ZOX:[id number]  WJX:[id number]  DOB - [birth year]  Admit date - 04/25/2014  Admitting Physician Carron CurieAli Antonietta Lansdowne, MD  Outpatient Primary MD for the patient is PROVIDER NOT IN SYSTEM  LOS - 10   No chief complaint on file.          Subjective:   Lucas Glover has no new complaints  Objective:   Vital signs  Temperature 98. Heart rate 62 Respiratory rate 21 Blood pressure 104/67 Pulse ox 98%    Exam Awake Alert, confused, No new F.N deficits, [place].AT,PERRAL Supple Neck,No JVD, No cervical lymphadenopathy appreciated.  Tracheostomy midline Symmetrical Chest wall movement, decreased breath sounds bilaterally,  RRR,No Gallops,Rubs or new Murmurs, No Parasternal Heave +ve B.Sounds, Abd Soft, Non tender, No organomegaly appriciated, No rebound - guarding or rigidity. PEG tube noted No Cyanosis, Clubbing or edema, No new Rash or bruise  /left   I&Os 1636/444 Peg Tube Foley - yes Trach yes  Data Review   CBC  Recent Labs Lab 04/29/14 0545 05/02/14 1130 05/05/14 0600  WBC 9.2 10.6* 10.8*  HGB 9.0* 9.5* 8.2*  HCT 28.0* 31.1* 26.8*  PLT 211 217 203  MCV 94.0 96.6 93.7  MCH 30.2 29.5 28.7  MCHC 32.1 30.5 30.6  RDW 14.4 14.5 14.4  LYMPHSABS 1.2 0.9  --   MONOABS 1.0 0.7  --   EOSABS 0.1 0.3  --   BASOSABS 0.0 0.0  --     Chemistries   Recent Labs Lab 04/29/14 0545 05/02/14 1130 05/05/14 0600  NA 140 141 140  K 4.3 4.4 3.8  CL 100 98 99  CO2 33* 34* 32  GLUCOSE 143* 141* 90  BUN 17 17 17   CREATININE 0.66 0.74 0.88  CALCIUM 8.7 8.7 8.7   ------------------------------------------------------------------------------------------------------------------ CrCl is unknown because there is no height  on file for the current visit. ------------------------------------------------------------------------------------------------------------------ No results found for this basename: HGBA1C,  in the last 72 hours ------------------------------------------------------------------------------------------------------------------ No results found for this basename: CHOL, HDL, LDLCALC, TRIG, CHOLHDL, LDLDIRECT,  in the last 72 hours ------------------------------------------------------------------------------------------------------------------ No results found for this basename: TSH, T4TOTAL, FREET3, T3FREE, THYROIDAB,  in the last 72 hours ------------------------------------------------------------------------------------------------------------------ No results found for this basename: VITAMINB12, FOLATE, FERRITIN, TIBC, IRON, RETICCTPCT,  in the last 72 hours  Coagulation profile No results found for this basename: INR, PROTIME,  in the last 168 hours  No results found for this basename: DDIMER,  in the last 72 hours  Cardiac Enzymes  Recent Labs Lab 05/02/14 1130 05/03/14 1600  CKMB 1.3 1.3  TROPONINI <0.30  --    ------------------------------------------------------------------------------------------------------------------ No components found with this basename: POCBNP,   Micro Results Recent Results (from the past 240 hour(s))  URINE CULTURE     Status: None   Collection Time    05/02/14 10:11 AM      Result Value Ref  Range Status   Specimen Description URINE, RANDOM   Final   Special Requests NONE   Final   Culture  Setup Time     Final   Value: 05/02/2014 17:54     Performed at [facility]   Colony Count     Final   Value: NO GROWTH     Performed at [facility]   Culture     Final   Value: NO GROWTH     Performed at [facility]   Report Status 05/04/2014 FINAL   Final  CULTURE, BLOOD (ROUTINE X 2)     Status: None   Collection Time     05/02/14 12:00 PM      Result Value Ref Range Status   Specimen Description BLOOD PICC LINE   Final   Special Requests BOTTLES DRAWN AEROBIC AND ANAEROBIC 10CC   Final   Culture  Setup Time     Final   Value: 05/02/2014 17:38     Performed at [facility]   Culture     Final   Value:        BLOOD CULTURE RECEIVED NO GROWTH TO DATE CULTURE WILL BE HELD FOR 5 DAYS BEFORE ISSUING A FINAL NEGATIVE REPORT     Performed at [facility]   Report Status PENDING   Incomplete       Assessment & Plan    Respiratory failure, ventilator dependent; continue with APC canals  Mersa pneumonia on IV vancomycin Seizures on Keppra C. difficile on by mouth Flagyl Hypertension controlled Dementia agitation continue with Seroquel/Klonopin History of abdominal aortic aneurysm-chronic to follow as outpatient Anemia Will monitor hemoglobin Depression start Zoloft Protein calorie malnutrition on Vital Renal mass follow as outpatient Generalized weakness; PT OT as tolerated Low TSH probably sick euthyroid syndrome;  Plan Continue with duonebs  Code Status: Full   DVT Prophylaxis  Lovenox   Carron CurieAli Anihya Tuma M.D on 05/05/2014 at 9:34 AM

## 2014-05-06 NOTE — Progress Notes (Addendum)
[facility]                                                                                              Progress note     Patient Demographics  Lucas Glover, is a 63 y.o. male  ZOX:[id number]  WJX:[id number]  DOB - 1951/07/04  Admit date - 04/25/2014  Admitting Physician Carron CurieAli Presleigh Feldstein, MD  Outpatient Primary MD for the patient is PROVIDER NOT IN SYSTEM  LOS - 11   No chief complaint on file.          Subjective:   Lucas Glover has no new complaints  Objective:   Vital signs  Temperature 97.1 Heart rate 75 Respiratory rate 20 Blood pressure 112/76 Pulse ox 100 %    Exam Awake Alert, confused, No new F.N deficits, [place].AT,PERRAL Supple Neck,No JVD, No cervical lymphadenopathy appreciated.  Tracheostomy midline Symmetrical Chest wall movement, decreased breath sounds bilaterally,  RRR,No Gallops,Rubs or new Murmurs, No Parasternal Heave +ve B.Sounds, Abd Soft, Non tender, No organomegaly appriciated, No rebound - guarding or rigidity. PEG tube noted No Cyanosis, Clubbing or edema, No new Rash or bruise  /left   I&Os 2184/2100 Peg Tube Foley -DC Trach yes  Data Review   CBC  Recent Labs Lab 05/02/14 1130 05/05/14 0600  WBC 10.6* 10.8*  HGB 9.5* 8.2*  HCT 31.1* 26.8*  PLT 217 203  MCV 96.6 93.7  MCH 29.5 28.7  MCHC 30.5 30.6  RDW 14.5 14.4  LYMPHSABS 0.9  --   MONOABS 0.7  --   EOSABS 0.3  --   BASOSABS 0.0  --     Chemistries   Recent Labs Lab 05/02/14 1130 05/05/14 0600  NA 141 140  K 4.4 3.8  CL 98 99  CO2 34* 32  GLUCOSE 141* 90  BUN 17 17  CREATININE 0.74 0.88  CALCIUM 8.7 8.7   ------------------------------------------------------------------------------------------------------------------ CrCl is unknown because there is no height on file for the current  visit. ------------------------------------------------------------------------------------------------------------------ No results found for this basename: HGBA1C,  in the last 72 hours ------------------------------------------------------------------------------------------------------------------ No results found for this basename: CHOL, HDL, LDLCALC, TRIG, CHOLHDL, LDLDIRECT,  in the last 72 hours ------------------------------------------------------------------------------------------------------------------ No results found for this basename: TSH, T4TOTAL, FREET3, T3FREE, THYROIDAB,  in the last 72 hours ------------------------------------------------------------------------------------------------------------------ No results found for this basename: VITAMINB12, FOLATE, FERRITIN, TIBC, IRON, RETICCTPCT,  in the last 72 hours  Coagulation profile No results found for this basename: INR, PROTIME,  in the last 168 hours  No results found for this basename: DDIMER,  in the last 72 hours  Cardiac Enzymes  Recent Labs Lab 05/02/14 1130 05/03/14 1600  CKMB 1.3 1.3  TROPONINI <0.30  --    ------------------------------------------------------------------------------------------------------------------ No components found with this basename: POCBNP,   Micro Results Recent Results (from the past 240 hour(s))  URINE CULTURE     Status: None   Collection Time    05/02/14 10:11 AM      Result Value Ref Range Status   Specimen Description URINE, RANDOM   Final   Special Requests NONE   Final   Culture  Setup  Time     Final   Value: 05/02/2014 17:54     Performed at [facility]   Colony Count     Final   Value: NO GROWTH     Performed at [facility]   Culture     Final   Value: NO GROWTH     Performed at [facility]   Report Status 05/04/2014 FINAL   Final  CULTURE, BLOOD (ROUTINE X 2)     Status: None   Collection Time    05/02/14 12:00 PM       Result Value Ref Range Status   Specimen Description BLOOD PICC LINE   Final   Special Requests BOTTLES DRAWN AEROBIC AND ANAEROBIC 10CC   Final   Culture  Setup Time     Final   Value: 05/02/2014 17:38     Performed at [facility]   Culture     Final   Value:        BLOOD CULTURE RECEIVED NO GROWTH TO DATE CULTURE WILL BE HELD FOR 5 DAYS BEFORE ISSUING A FINAL NEGATIVE REPORT     Performed at [facility]   Report Status PENDING   Incomplete       Assessment & Plan    Respiratory failure, ventilator dependent; continue with ATC trials COPD on neb treatments Mersa pneumonia on IV vancomycin Seizures on Keppra, controlled C. difficile on by mouth Flagyl Hypertension controlled Dementia agitation continue with Seroquel/Klonopin History of abdominal aortic aneurysm-chronic to follow as outpatient Anemia Will monitor hemoglobin Depression start Zoloft Protein calorie malnutrition on Vital Renal mass follow as outpatient Generalized weakness; PT OT as tolerated Low TSH probably sick euthyroid syndrome;  Plan Continue with duonebs  discontinue  Foley Check labs in a.m.  Code Status: Full   DVT Prophylaxis  Lovenox   Carron CurieAli Terri Malerba M.D on 05/06/2014 at 10:59 AM

## 2014-05-07 ENCOUNTER — Other Ambulatory Visit (HOSPITAL_COMMUNITY): Payer: Medicare Other

## 2014-05-07 LAB — CBC
HEMATOCRIT: 30.6 % — AB (ref 39.0–52.0)
HEMOGLOBIN: 9.7 g/dL — AB (ref 13.0–17.0)
MCH: 29.8 pg (ref 26.0–34.0)
MCHC: 31.7 g/dL (ref 30.0–36.0)
MCV: 93.9 fL (ref 78.0–100.0)
Platelets: 221 10*3/uL (ref 150–400)
RBC: 3.26 MIL/uL — AB (ref 4.22–5.81)
RDW: 14.5 % (ref 11.5–15.5)
WBC: 14.6 10*3/uL — ABNORMAL HIGH (ref 4.0–10.5)

## 2014-05-07 LAB — BASIC METABOLIC PANEL
BUN: 16 mg/dL (ref 6–23)
CHLORIDE: 100 meq/L (ref 96–112)
CO2: 29 meq/L (ref 19–32)
Calcium: 9 mg/dL (ref 8.4–10.5)
Creatinine, Ser: 0.74 mg/dL (ref 0.50–1.35)
GFR calc Af Amer: >90 mL/min (ref >90)
GFR calc non Af Amer: >90 mL/min (ref >90)
Glucose, Bld: 114 mg/dL — ABNORMAL HIGH (ref 70–99)
Potassium: 4.3 mEq/L (ref 3.7–5.3)
Sodium: 139 mEq/L (ref 137–147)

## 2014-05-07 NOTE — Progress Notes (Signed)
[facility]                                                                                              Progress note     Patient Demographics  Lucas Glover, is a 63 y.o. male  ZOX:[id number]  WJX:[id number]  DOB - October 07, 1951  Admit date - 04/25/2014  Admitting Physician Carron CurieAli Sande Pickert, MD  Outpatient Primary MD for the patient is PROVIDER NOT IN SYSTEM  LOS - 12   No chief complaint on file.          Subjective:   Lucas Glover has no new complaints  Objective:   Vital signs  Temperature 96.4 Heart rate 68 Respiratory rate 20 Blood pressure 112/76 Pulse ox 100 %    Exam Awake Alert, confused, No new F.N deficits, [place].AT,PERRAL Supple Neck,No JVD, No cervical lymphadenopathy appreciated.  Tracheostomy midline Symmetrical Chest wall movement, decreased breath sounds bilaterally,  RRR,No Gallops,Rubs or new Murmurs, No Parasternal Heave +ve B.Sounds, Abd Soft, Non tender, No organomegaly appriciated, No rebound - guarding or rigidity. PEG tube noted No Cyanosis, Clubbing or edema, No new Rash or bruise  /left   I&Os unknown Peg Tube yes Trach yes  Data Review   CBC  Recent Labs Lab 05/02/14 1130 05/05/14 0600 05/07/14 0633  WBC 10.6* 10.8* 14.6*  HGB 9.5* 8.2* 9.7*  HCT 31.1* 26.8* 30.6*  PLT 217 203 221  MCV 96.6 93.7 93.9  MCH 29.5 28.7 29.8  MCHC 30.5 30.6 31.7  RDW 14.5 14.4 14.5  LYMPHSABS 0.9  --   --   MONOABS 0.7  --   --   EOSABS 0.3  --   --   BASOSABS 0.0  --   --     Chemistries   Recent Labs Lab 05/02/14 1130 05/05/14 0600 05/07/14 0633  NA 141 140 139  K 4.4 3.8 4.3  CL 98 99 100  CO2 34* 32 29  GLUCOSE 141* 90 114*  BUN 17 17 16   CREATININE 0.74 0.88 0.74  CALCIUM 8.7 8.7 9.0   ------------------------------------------------------------------------------------------------------------------ CrCl is unknown because there is no height  on file for the current visit. ------------------------------------------------------------------------------------------------------------------ No results found for this basename: HGBA1C,  in the last 72 hours ------------------------------------------------------------------------------------------------------------------ No results found for this basename: CHOL, HDL, LDLCALC, TRIG, CHOLHDL, LDLDIRECT,  in the last 72 hours ------------------------------------------------------------------------------------------------------------------ No results found for this basename: TSH, T4TOTAL, FREET3, T3FREE, THYROIDAB,  in the last 72 hours ------------------------------------------------------------------------------------------------------------------ No results found for this basename: VITAMINB12, FOLATE, FERRITIN, TIBC, IRON, RETICCTPCT,  in the last 72 hours  Coagulation profile No results found for this basename: INR, PROTIME,  in the last 168 hours  No results found for this basename: DDIMER,  in the last 72 hours  Cardiac Enzymes  Recent Labs Lab 05/02/14 1130 05/03/14 1600  CKMB 1.3 1.3  TROPONINI <0.30  --    ------------------------------------------------------------------------------------------------------------------ No components found with this basename: POCBNP,   Micro Results Recent Results (from the past 240 hour(s))  URINE CULTURE     Status: None   Collection Time    05/02/14 10:11 AM  Result Value Ref Range Status   Specimen Description URINE, RANDOM   Final   Special Requests NONE   Final   Culture  Setup Time     Final   Value: 05/02/2014 17:54     Performed at [facility]   Colony Count     Final   Value: NO GROWTH     Performed at [facility]   Culture     Final   Value: NO GROWTH     Performed at [facility]   Report Status 05/04/2014 FINAL   Final  CULTURE, BLOOD (ROUTINE X 2)     Status: None   Collection Time     05/02/14 12:00 PM      Result Value Ref Range Status   Specimen Description BLOOD PICC LINE   Final   Special Requests BOTTLES DRAWN AEROBIC AND ANAEROBIC 10CC   Final   Culture  Setup Time     Final   Value: 05/02/2014 17:38     Performed at [facility]   Culture     Final   Value:        BLOOD CULTURE RECEIVED NO GROWTH TO DATE CULTURE WILL BE HELD FOR 5 DAYS BEFORE ISSUING A FINAL NEGATIVE REPORT     Performed at [facility]   Report Status PENDING   Incomplete       Assessment & Plan    Respiratory failure, ventilator dependent; continue with ATC trials COPD on neb treatments Mersa pneumonia on IV vancomycin Seizures on Keppra, controlled C. difficile on by mouth Flagyl Hypertension controlled Dementia agitation continue with Seroquel/Klonopin History of abdominal aortic aneurysm-chronic to follow as outpatient Anemia Will monitor hemoglobin Depression start Zoloft Protein calorie malnutrition on Vital Renal mass follow as outpatient Generalized weakness; PT OT as tolerated Low TSH probably sick euthyroid syndrome; Mild pulmonary edema; start Lasix IV and by mouth  Plan Lasix IV now and then Lasix 40 mg per tube daily Code Status: Full   DVT Prophylaxis  Lovenox   Carron CurieAli Tylee Newby M.D on 05/07/2014 at 12:37 PM

## 2014-05-08 ENCOUNTER — Other Ambulatory Visit (HOSPITAL_COMMUNITY): Payer: Medicare Other

## 2014-05-08 LAB — CULTURE, BLOOD (ROUTINE X 2): Culture: NO GROWTH

## 2014-05-08 NOTE — Progress Notes (Addendum)
[facility]                                                                                              Progress note     Patient Demographics  Lucas Glover, is a 63 y.o. male  ZOX:[id number]  WJX:[id number]  DOB - 1951-09-16  Admit date - 04/25/2014  Admitting Physician Carron CurieAli Arrion Burruel, MD  Outpatient Primary MD for the patient is PROVIDER NOT IN SYSTEM  LOS - 13   No chief complaint on file.          Subjective:   Lucas Glover has no new complaints  Objective:   Vital signs  Temperature 98.0 Heart rate 70 Respiratory rate 21 Blood pressure 117/70 Pulse ox 100 %    Exam Awake Alert, confused, No new F.N deficits, [place].AT,PERRAL Supple Neck,No JVD, No cervical lymphadenopathy appreciated.  Tracheostomy midline Symmetrical Chest wall movement, decreased breath sounds bilaterally,  RRR,No Gallops,Rubs or new Murmurs, No Parasternal Heave +ve B.Sounds, Abd Soft, Non tender, No organomegaly appriciated, No rebound - guarding or rigidity. PEG tube noted No Cyanosis, Clubbing or edema, No new Rash or bruise  /left   I&Os unknown Peg Tube yes Trach #8 [place]  Data Review   CBC  Recent Labs Lab 05/02/14 1130 05/05/14 0600 05/07/14 0633  WBC 10.6* 10.8* 14.6*  HGB 9.5* 8.2* 9.7*  HCT 31.1* 26.8* 30.6*  PLT 217 203 221  MCV 96.6 93.7 93.9  MCH 29.5 28.7 29.8  MCHC 30.5 30.6 31.7  RDW 14.5 14.4 14.5  LYMPHSABS 0.9  --   --   MONOABS 0.7  --   --   EOSABS 0.3  --   --   BASOSABS 0.0  --   --     Chemistries   Recent Labs Lab 05/02/14 1130 05/05/14 0600 05/07/14 0633  NA 141 140 139  K 4.4 3.8 4.3  CL 98 99 100  CO2 34* 32 29  GLUCOSE 141* 90 114*  BUN 17 17 16   CREATININE 0.74 0.88 0.74  CALCIUM 8.7 8.7 9.0   ------------------------------------------------------------------------------------------------------------------ CrCl is unknown because there is no  height on file for the current visit. ------------------------------------------------------------------------------------------------------------------ No results found for this basename: HGBA1C,  in the last 72 hours ------------------------------------------------------------------------------------------------------------------ No results found for this basename: CHOL, HDL, LDLCALC, TRIG, CHOLHDL, LDLDIRECT,  in the last 72 hours ------------------------------------------------------------------------------------------------------------------ No results found for this basename: TSH, T4TOTAL, FREET3, T3FREE, THYROIDAB,  in the last 72 hours ------------------------------------------------------------------------------------------------------------------ No results found for this basename: VITAMINB12, FOLATE, FERRITIN, TIBC, IRON, RETICCTPCT,  in the last 72 hours  Coagulation profile No results found for this basename: INR, PROTIME,  in the last 168 hours  No results found for this basename: DDIMER,  in the last 72 hours  Cardiac Enzymes  Recent Labs Lab 05/02/14 1130 05/03/14 1600  CKMB 1.3 1.3  TROPONINI <0.30  --    ------------------------------------------------------------------------------------------------------------------ No components found with this basename: POCBNP,   Micro Results Recent Results (from the past 240 hour(s))  URINE CULTURE     Status: None   Collection Time    05/02/14 10:11 AM  Result Value Ref Range Status   Specimen Description URINE, RANDOM   Final   Special Requests NONE   Final   Culture  Setup Time     Final   Value: 05/02/2014 17:54     Performed at [facility]   Colony Count     Final   Value: NO GROWTH     Performed at [facility]   Culture     Final   Value: NO GROWTH     Performed at [facility]   Report Status 05/04/2014 FINAL   Final  CULTURE, BLOOD (ROUTINE X 2)     Status: None   Collection  Time    05/02/14 12:00 PM      Result Value Ref Range Status   Specimen Description BLOOD PICC LINE   Final   Special Requests BOTTLES DRAWN AEROBIC AND ANAEROBIC 10CC   Final   Culture  Setup Time     Final   Value: 05/02/2014 17:38     Performed at [facility]   Culture     Final   Value: NO GROWTH 5 DAYS     Performed at [facility]   Report Status 05/08/2014 FINAL   Final       Assessment & Plan    Respiratory failure, ventilator dependent; continue with ATC trials finished stage IV  COPD on neb treatments Mersa pneumonia on IV vancomycin Seizures on Keppra, controlled C. difficile on by mouth Flagyl Hypertension controlled Dementia /agitation continue with Seroquel/Klonopin History of abdominal aortic aneurysm-chronic to follow as outpatient Anemia Will monitor hemoglobin Depression start Zoloft Protein calorie malnutrition on Vital Renal mass follow as outpatient Generalized weakness; PT OT as tolerated Low TSH probably sick euthyroid syndrome; Mild pulmonary edema; start Lasix IV and by mouth  Plan Check labs/chest x-ray  in a.m.  CODE STATUS full   DVT Prophylaxis  Lovenox   Carron CurieAli Sukhmani Fetherolf M.D on 05/08/2014 at 10:24 AM

## 2014-05-09 ENCOUNTER — Other Ambulatory Visit (HOSPITAL_COMMUNITY): Payer: Medicare Other

## 2014-05-09 LAB — BASIC METABOLIC PANEL
BUN: 15 mg/dL (ref 6–23)
CALCIUM: 9.4 mg/dL (ref 8.4–10.5)
CO2: 34 mEq/L — ABNORMAL HIGH (ref 19–32)
Chloride: 98 mEq/L (ref 96–112)
Creatinine, Ser: 0.83 mg/dL (ref 0.50–1.35)
GFR calc Af Amer: >90 mL/min (ref >90)
GFR calc non Af Amer: >90 mL/min (ref >90)
Glucose, Bld: 111 mg/dL — ABNORMAL HIGH (ref 70–99)
POTASSIUM: 3.6 meq/L — AB (ref 3.7–5.3)
Sodium: 141 mEq/L (ref 137–147)

## 2014-05-09 LAB — CBC
HEMATOCRIT: 32.2 % — AB (ref 39.0–52.0)
HEMOGLOBIN: 10 g/dL — AB (ref 13.0–17.0)
MCH: 29.7 pg (ref 26.0–34.0)
MCHC: 31.1 g/dL (ref 30.0–36.0)
MCV: 95.5 fL (ref 78.0–100.0)
Platelets: 225 10*3/uL (ref 150–400)
RBC: 3.37 MIL/uL — AB (ref 4.22–5.81)
RDW: 14.8 % (ref 11.5–15.5)
WBC: 16.5 10*3/uL — ABNORMAL HIGH (ref 4.0–10.5)

## 2014-05-09 NOTE — Progress Notes (Signed)
[facility]                                                                                              Progress note     Patient Demographics  Lucas Glover, is a 63 y.o. male  HYQ:[id number]  XBM:[id number]  DOB - 1951/04/04  Admit date - 04/25/2014  Admitting Physician Carron CurieAli Alain Deschene, MD  Outpatient Primary MD for the patient is PROVIDER NOT IN SYSTEM  LOS - 14   No chief complaint on file.          Subjective:   Lucas RusselJames Onken has no new complaints  Objective:   Vital signs  Temperature 98.9 Heart rate 94 Respiratory rate 26 Blood pressure 127/77 Pulse ox 100 %    Exam Awake Alert, confused, No new F.N deficits, [place].AT,PERRAL Supple Neck,No JVD, No cervical lymphadenopathy appreciated.  Tracheostomy midline Symmetrical Chest wall movement, decreased breath sounds bilaterally,  RRR,No Gallops,Rubs or new Murmurs, No Parasternal Heave +ve B.Sounds, Abd Soft, Non tender, No organomegaly appriciated, No rebound - guarding or rigidity. PEG tube noted No Cyanosis, Clubbing or edema, No new Rash or bruise  /left   I&Os unknown Peg Tube yes Trach #8 Shiley  Data Review   CBC  Recent Labs Lab 05/02/14 1130 05/05/14 0600 05/07/14 0633 05/09/14 0500  WBC 10.6* 10.8* 14.6* 16.5*  HGB 9.5* 8.2* 9.7* 10.0*  HCT 31.1* 26.8* 30.6* 32.2*  PLT 217 203 221 225  MCV 96.6 93.7 93.9 95.5  MCH 29.5 28.7 29.8 29.7  MCHC 30.5 30.6 31.7 31.1  RDW 14.5 14.4 14.5 14.8  LYMPHSABS 0.9  --   --   --   MONOABS 0.7  --   --   --   EOSABS 0.3  --   --   --   BASOSABS 0.0  --   --   --     Chemistries   Recent Labs Lab 05/02/14 1130 05/05/14 0600 05/07/14 0633 05/09/14 0500  NA 141 140 139 141  K 4.4 3.8 4.3 3.6*  CL 98 99 100 98  CO2 34* 32 29 34*  GLUCOSE 141* 90 114* 111*  BUN 17 17 16 15   CREATININE 0.74 0.88 0.74 0.83  CALCIUM 8.7 8.7 9.0 9.4    ------------------------------------------------------------------------------------------------------------------ CrCl is unknown because there is no height on file for the current visit. ------------------------------------------------------------------------------------------------------------------ No results found for this basename: HGBA1C,  in the last 72 hours ------------------------------------------------------------------------------------------------------------------ No results found for this basename: CHOL, HDL, LDLCALC, TRIG, CHOLHDL, LDLDIRECT,  in the last 72 hours ------------------------------------------------------------------------------------------------------------------ No results found for this basename: TSH, T4TOTAL, FREET3, T3FREE, THYROIDAB,  in the last 72 hours ------------------------------------------------------------------------------------------------------------------ No results found for this basename: VITAMINB12, FOLATE, FERRITIN, TIBC, IRON, RETICCTPCT,  in the last 72 hours  Coagulation profile No results found for this basename: INR, PROTIME,  in the last 168 hours  No results found for this basename: DDIMER,  in the last 72 hours  Cardiac Enzymes  Recent Labs Lab 05/02/14 1130 05/03/14 1600  CKMB 1.3 1.3  TROPONINI <0.30  --    ------------------------------------------------------------------------------------------------------------------ No components found with this  basename: POCBNP,   Micro Results Recent Results (from the past 240 hour(s))  URINE CULTURE     Status: None   Collection Time    05/02/14 10:11 AM      Result Value Ref Range Status   Specimen Description URINE, RANDOM   Final   Special Requests NONE   Final   Culture  Setup Time     Final   Value: 05/02/2014 17:54     Performed at [facility]   Colony Count     Final   Value: NO GROWTH     [facility]formed at [facility]   Culture     Final   Value:  NO GROWTH     Performed at [facility]   Report Status 05/04/2014 FINAL   Final  CULTURE, BLOOD (ROUTINE X 2)     Status: None   Collection Time    05/02/14 12:00 PM      Result Value Ref Range Status   Specimen Description BLOOD PICC LINE   Final   Special Requests BOTTLES DRAWN AEROBIC AND ANAEROBIC 10CC   Final   Culture  Setup Time     Final   Value: 05/02/2014 17:38     Performed at [facility]   Culture     Final   Value: NO GROWTH 5 DAYS     Performed at [facility]   Report Status 05/08/2014 FINAL   Final     Chest x-ray on 5/16 shows no acute changes  Assessment & Plan    Respiratory failure, ventilator dependent; continue with ATC trials   COPD on neb treatments Mersa pneumonia off IV vancomycin , chest x-ray shows no acute changes Seizures on Keppra, controlled C. difficile on by mouth Flagyl Hypertension controlled Dementia /agitation continue with Seroquel/Klonopin History of abdominal aortic aneurysm-chronic to follow as outpatient Anemia Will monitor hemoglobin Depression start Zoloft Protein calorie malnutrition on Vital Renal mass follow as outpatient Generalized weakness; PT OT as tolerated Low TSH probably sick euthyroid syndrome; Mild pulmonary edema; continue with Lasix Leukocytosis check urinalysis and decrease prednisone to 10 mg daily  Plan  Check urinalysis Decrease prednisone to 10 mg by mouth daily CODE STATUS full   DVT Prophylaxis  Lovenox   Carron CurieAli Jesiel Garate M.D on 05/09/2014 at 11:19 AM

## 2014-05-10 LAB — URINALYSIS, ROUTINE W REFLEX MICROSCOPIC
BILIRUBIN URINE: NEGATIVE
Glucose, UA: NEGATIVE mg/dL
HGB URINE DIPSTICK: NEGATIVE
Ketones, ur: 15 mg/dL — AB
Nitrite: NEGATIVE
Protein, ur: NEGATIVE mg/dL
SPECIFIC GRAVITY, URINE: 1.027 (ref 1.005–1.030)
UROBILINOGEN UA: 0.2 mg/dL (ref 0.0–1.0)
pH: 6.5 (ref 5.0–8.0)

## 2014-05-10 LAB — URINE MICROSCOPIC-ADD ON

## 2014-05-10 NOTE — Progress Notes (Signed)
[facility]                                                                                              Progress note     Patient Demographics  Lucas RusselJames Wengert, is a 63 y.o. male  JXB:[id number]  ZHY:[id number]  DOB - October 23, 1951  Admit date - 04/25/2014  Admitting Physician Carron CurieAli Onika Gudiel, MD  Outpatient Primary MD for the patient is PROVIDER NOT IN SYSTEM  LOS - 15   No chief complaint on file.          Subjective:   Lucas Glover has no new complaints  Objective:   Vital signs  Temperature 98.0 Heart rate 83 Respiratory rate 26 Blood pressure 120/81 Pulse ox 100 %    Exam Awake Alert, confused, No new F.N deficits, [place].AT,PERRAL Supple Neck,No JVD, No cervical lymphadenopathy appreciated.  Tracheostomy midline Symmetrical Chest wall movement, decreased breath sounds bilaterally,  RRR,No Gallops,Rubs or new Murmurs, No Parasternal Heave +ve B.Sounds, Abd Soft, Non tender, No organomegaly appriciated, No rebound - guarding or rigidity. PEG tube noted No Cyanosis, Clubbing or edema, No new Rash or bruise  /left   I&Os unknown Peg Tube yes Trach #8 Shiley  Data Review   CBC  Recent Labs Lab 05/05/14 0600 05/07/14 0633 05/09/14 0500  WBC 10.8* 14.6* 16.5*  HGB 8.2* 9.7* 10.0*  HCT 26.8* 30.6* 32.2*  PLT 203 221 225  MCV 93.7 93.9 95.5  MCH 28.7 29.8 29.7  MCHC 30.6 31.7 31.1  RDW 14.4 14.5 14.8    Chemistries   Recent Labs Lab 05/05/14 0600 05/07/14 0633 05/09/14 0500  NA 140 139 141  K 3.8 4.3 3.6*  CL 99 100 98  CO2 32 29 34*  GLUCOSE 90 114* 111*  BUN 17 16 15   CREATININE 0.88 0.74 0.83  CALCIUM 8.7 9.0 9.4   ------------------------------------------------------------------------------------------------------------------ CrCl is unknown because there is no height on file for the current  visit. ------------------------------------------------------------------------------------------------------------------ No results found for this basename: HGBA1C,  in the last 72 hours ------------------------------------------------------------------------------------------------------------------ No results found for this basename: CHOL, HDL, LDLCALC, TRIG, CHOLHDL, LDLDIRECT,  in the last 72 hours ------------------------------------------------------------------------------------------------------------------ No results found for this basename: TSH, T4TOTAL, FREET3, T3FREE, THYROIDAB,  in the last 72 hours ------------------------------------------------------------------------------------------------------------------ No results found for this basename: VITAMINB12, FOLATE, FERRITIN, TIBC, IRON, RETICCTPCT,  in the last 72 hours  Coagulation profile No results found for this basename: INR, PROTIME,  in the last 168 hours  No results found for this basename: DDIMER,  in the last 72 hours  Cardiac Enzymes  Recent Labs Lab 05/03/14 1600  CKMB 1.3   ------------------------------------------------------------------------------------------------------------------ No components found with this basename: POCBNP,   Micro Results Recent Results (from the past 240 hour(s))  URINE CULTURE     Status: None   Collection Time    05/02/14 10:11 AM      Result Value Ref Range Status   Specimen Description URINE, RANDOM   Final   Special Requests NONE   Final   Culture  Setup Time     Final   Value: 05/02/2014 17:54  Performed at [facility]   Colony Count     Final   Value: NO GROWTH     Performed at [facility]   Culture     Final   Value: NO GROWTH     Performed at [facility]   Report Status 05/04/2014 FINAL   Final  CULTURE, BLOOD (ROUTINE X 2)     Status: None   Collection Time    05/02/14 12:00 PM      Result Value Ref Range Status   Specimen  Description BLOOD PICC LINE   Final   Special Requests BOTTLES DRAWN AEROBIC AND ANAEROBIC 10CC   Final   Culture  Setup Time     Final   Value: 05/02/2014 17:38     Performed at [facility]   Culture     Final   Value: NO GROWTH 5 DAYS     Performed at [facility]   Report Status 05/08/2014 FINAL   Final     Chest x-ray on 5/16 shows no acute changes  Assessment & Plan    Respiratory failure, ventilator dependent; continue with ATC trials still needing vent at night  COPD on neb treatments Mersa pneumonia off IV vancomycin , chest x-ray shows no acute changes Seizures on Keppra, controlled C. difficile on by mouth Flagyl Hypertension controlled Dementia /agitation continue with Seroquel/Klonopin History of abdominal aortic aneurysm-chronic to follow as outpatient Anemia Will monitor hemoglobin Depression start Zoloft Protein calorie malnutrition on Vital Renal mass follow as outpatient Generalized weakness; PT OT as tolerated Low TSH probably sick euthyroid syndrome; Mild pulmonary edema; continue with Lasix Leukocytosis check urinalysis and decrease prednisone to 10 mg daily  Plan  Check labs in a.m.  CODE STATUS full   DVT Prophylaxis  Lovenox   Carron CurieAli Jerrion Tabbert M.D on 05/10/2014 at 11:34 AM

## 2014-05-11 ENCOUNTER — Other Ambulatory Visit (HOSPITAL_COMMUNITY): Payer: Medicare Other

## 2014-05-11 LAB — BASIC METABOLIC PANEL
BUN: 18 mg/dL (ref 6–23)
CO2: 35 meq/L — AB (ref 19–32)
CREATININE: 0.82 mg/dL (ref 0.50–1.35)
Calcium: 8.9 mg/dL (ref 8.4–10.5)
Chloride: 96 mEq/L (ref 96–112)
GFR calc Af Amer: >90 mL/min (ref >90)
GFR calc non Af Amer: >90 mL/min (ref >90)
GLUCOSE: 119 mg/dL — AB (ref 70–99)
POTASSIUM: 4.3 meq/L (ref 3.7–5.3)
Sodium: 139 mEq/L (ref 137–147)

## 2014-05-11 LAB — CBC
HEMATOCRIT: 29.4 % — AB (ref 39.0–52.0)
HEMOGLOBIN: 9.2 g/dL — AB (ref 13.0–17.0)
MCH: 30.1 pg (ref 26.0–34.0)
MCHC: 31.3 g/dL (ref 30.0–36.0)
MCV: 96.1 fL (ref 78.0–100.0)
Platelets: 204 10*3/uL (ref 150–400)
RBC: 3.06 MIL/uL — AB (ref 4.22–5.81)
RDW: 14.8 % (ref 11.5–15.5)
WBC: 11.9 10*3/uL — AB (ref 4.0–10.5)

## 2014-05-11 LAB — PROCALCITONIN: Procalcitonin: 0.28 ng/mL

## 2014-05-11 NOTE — Progress Notes (Signed)
[facility]                                                                                              Progress note     Patient Demographics  Henry RusselJames Runnels, is a 63 y.o. male  ZOX:[id number]  WJX:[id number]  DOB - February 20, 1951  Admit date - 04/25/2014  Admitting Physician Carron CurieAli Homer Miller, MD  Outpatient Primary MD for the patient is PROVIDER NOT IN SYSTEM  LOS - 16   Chief complaint   Respiratory failure         Subjective:   Henry RusselJames Passero has no new complaints  Objective:   Vital signs  Temperature 96.8 Heart rate 87 Respiratory rate 20 Blood pressure 122/70 Pulse ox 98 %    Exam Awake Alert, confused, No new F.N deficits, [place].AT,PERRAL Supple Neck,No JVD, No cervical lymphadenopathy appreciated.  Tracheostomy midline Symmetrical Chest wall movement, decreased breath sounds bilaterally,  RRR,No Gallops,Rubs or new Murmurs, No Parasternal Heave +ve B.Sounds, Abd Soft, Non tender, No organomegaly appriciated, No rebound - guarding or rigidity. PEG tube noted No Cyanosis, Clubbing or edema, No new Rash or bruise  /left   I&Os unknown Peg Tube yes Trach #8 Shiley  Data Review   CBC  Recent Labs Lab 05/05/14 0600 05/07/14 0633 05/09/14 0500 05/11/14 0500  WBC 10.8* 14.6* 16.5* 11.9*  HGB 8.2* 9.7* 10.0* 9.2*  HCT 26.8* 30.6* 32.2* 29.4*  PLT 203 221 225 204  MCV 93.7 93.9 95.5 96.1  MCH 28.7 29.8 29.7 30.1  MCHC 30.6 31.7 31.1 31.3  RDW 14.4 14.5 14.8 14.8    Chemistries   Recent Labs Lab 05/05/14 0600 05/07/14 0633 05/09/14 0500 05/11/14 0500  NA 140 139 141 139  K 3.8 4.3 3.6* 4.3  CL 99 100 98 96  CO2 32 29 34* 35*  GLUCOSE 90 114* 111* 119*  BUN 17 16 15 18   CREATININE 0.88 0.74 0.83 0.82  CALCIUM 8.7 9.0 9.4 8.9   ------------------------------------------------------------------------------------------------------------------ CrCl is unknown because  there is no height on file for the current visit. ------------------------------------------------------------------------------------------------------------------ No results found for this basename: HGBA1C,  in the last 72 hours ------------------------------------------------------------------------------------------------------------------ No results found for this basename: CHOL, HDL, LDLCALC, TRIG, CHOLHDL, LDLDIRECT,  in the last 72 hours ------------------------------------------------------------------------------------------------------------------ No results found for this basename: TSH, T4TOTAL, FREET3, T3FREE, THYROIDAB,  in the last 72 hours ------------------------------------------------------------------------------------------------------------------ No results found for this basename: VITAMINB12, FOLATE, FERRITIN, TIBC, IRON, RETICCTPCT,  in the last 72 hours  Coagulation profile No results found for this basename: INR, PROTIME,  in the last 168 hours  No results found for this basename: DDIMER,  in the last 72 hours  Cardiac Enzymes No results found for this basename: CK, CKMB, TROPONINI, MYOGLOBIN,  in the last 168 hours ------------------------------------------------------------------------------------------------------------------ No components found with this basename: POCBNP,   Micro Results Recent Results (from the past 240 hour(s))  URINE CULTURE     Status: None   Collection Time    05/02/14 10:11 AM      Result Value Ref Range Status   Specimen Description URINE, RANDOM   Final  Special Requests NONE   Final   Culture  Setup Time     Final   Value: 05/02/2014 17:54     Performed at [facility]   Colony Count     Final   Value: NO GROWTH     Performed at [facility]   Culture     Final   Value: NO GROWTH     Performed at [facility]   Report Status 05/04/2014 FINAL   Final  CULTURE, BLOOD (ROUTINE X 2)     Status: None    Collection Time    05/02/14 12:00 PM      Result Value Ref Range Status   Specimen Description BLOOD PICC LINE   Final   Special Requests BOTTLES DRAWN AEROBIC AND ANAEROBIC 10CC   Final   Culture  Setup Time     Final   Value: 05/02/2014 17:38     Performed at [facility]   Culture     Final   Value: NO GROWTH 5 DAYS     Performed at [facility]   Report Status 05/08/2014 FINAL   Final     Chest x-ray on 5/16 shows no acute changes  Assessment & Plan    Respiratory failure, ventilator dependent; continue with ATC trials still needing vent   COPD on neb treatments Mersa pneumonia off IV vancomycin , chest x-ray shows no acute changes Seizures on Keppra, controlled C. difficile on by mouth Flagyl Hypertension controlled Dementia /agitation continue with Seroquel/Klonopin History of abdominal aortic aneurysm-chronic to follow as outpatient Anemia Will monitor hemoglobin Depression start Zoloft Protein calorie malnutrition on Vital Renal mass follow as outpatient Generalized weakness; PT OT as tolerated Low TSH probably sick euthyroid syndrome; Mild pulmonary edema; continue with Lasix Leukocytosis check urinalysis and decrease prednisone to 10 mg daily  Plan  Check echocardiogram  CODE STATUS full   DVT Prophylaxis  Lovenox   Carron CurieAli Jace Fermin M.D on 05/11/2014 at 10:51 AM

## 2014-05-11 NOTE — Progress Notes (Signed)
PULMONARY / CRITICAL CARE MEDICINE  Name: Lucas Glover MRN: [id number] DOB: [birth year]    ADMISSION DATE:  04/25/2014 CONSULTATION DATE:  04/27/2014  REFERRING MD :  hijazi PRIMARY SERVICE:  ssh  CHIEF COMPLAINT:  Vent weaning   BRIEF PATIENT DESCRIPTION: 63 yo with COPD and seizure disorder [facility] from mechanical ventilation. [facility]. His course was complicated by MRSA tracheobronchitis / HCAP, MRSA bacteremia, AECOPD, and C diff infection. He was extubated initially but required re-intubation for recurrent respiratory distress and ultimately required trach and PEG placement.  INTERVAL HISTORY :  No complaints, overnight events  VITAL SIGNS: Reviewed  PHYSICAL EXAMINATION: General:  No distress Neuro:  Awake, follows commands  HEENT:  Tracheostomy site intact Cardiovascular:  Regular, no murmurs Lungs:  Bilateral scattered rhonchi Abdomen:  Soft, non-tender, PEG site intact Musculoskeletal:  Intact  Skin:  Intact   LABS: CBC  Recent Labs Lab 05/07/14 0633 05/09/14 0500 05/11/14 0500  WBC 14.6* 16.5* 11.9*  HGB 9.7* 10.0* 9.2*  HCT 30.6* 32.2* 29.4*  PLT 221 225 204   Coag's No results found for this basename: APTT, INR,  in the last 168 hours BMET  Recent Labs Lab 05/07/14 0633 05/09/14 0500 05/11/14 0500  NA 139 141 139  K 4.3 3.6* 4.3  CL 100 98 96  CO2 29 34* 35*  BUN 16 15 18   CREATININE 0.74 0.83 0.82  GLUCOSE 114* 111* 119*   Electrolytes  Recent Labs Lab 05/07/14 0633 05/09/14 0500 05/11/14 0500  CALCIUM 9.0 9.4 8.9   Sepsis Markers  Recent Labs Lab 05/11/14 0500  PROCALCITON 0.28   ABG No results found for this basename: PHART, PCO2ART, PO2ART,  in the last 168 hours Liver Enzymes No results found for this basename: AST, ALT, ALKPHOS, BILITOT, ALBUMIN,  in the last 168 hours Cardiac Enzymes No results found for this  basename: TROPONINI, PROBNP,  in the last 168 hours Glucose No results found for this basename: GLUCAP,  in the last 168 hours  IMAGING:  Dg Chest Port 1 View  05/11/2014   CLINICAL DATA:  Respiratory failure.  EXAM: PORTABLE CHEST - 1 VIEW  COMPARISON:  DG CHEST 1V PORT dated 05/09/2014  FINDINGS: Tracheostomy tube, left PICC line in stable position. Mediastinum and hilar structures normal. Heart size normal. Lungs are clear. No pleural effusion or pneumothorax.  IMPRESSION: 1. Stable line and tube positions. 2. No acute cardiopulmonary disease.   Electronically Signed   By: Maisie Fushomas  Register   On: 05/11/2014 08:01    ASSESSMENT / PLAN:  Acute on chronic respiratory failure, not clearly explained by pulmonary pathology alone COPD, no evidence of acute bronchospasm Pulmonary edema Tracheostomy status  Seizure disorder Acute encephalopathy / delirium   Weaning per protocol  Bronchodilators  D/c systemic steroids as no acute bronchospasm and likely contributing to encephalopathy, may add inhaled  Aim for negative fluid balance  Agree with TTE  I have personally obtained history, examined patient, evaluated and interpreted laboratory and imaging results, reviewed medical records, formulated assessment / plan and placed orders.  Lonia FarberKonstantin Orella Cushman, MD Pulmonary and Critical Care Medicine [facility] Pager: [phone]  05/11/2014, 12:32 PM

## 2014-05-12 DIAGNOSIS — I519 Heart disease, unspecified: Secondary | ICD-10-CM

## 2014-05-12 NOTE — Progress Notes (Signed)
  Echocardiogram 2D Echocardiogram has been performed.  Lucas Glover 05/12/2014, 6:33 PM

## 2014-05-14 ENCOUNTER — Other Ambulatory Visit (HOSPITAL_COMMUNITY): Payer: Medicare Other

## 2014-05-14 LAB — BLOOD GAS, ARTERIAL
Acid-Base Excess: 7.6 mmol/L — ABNORMAL HIGH (ref 0.0–2.0)
BICARBONATE: 30.8 meq/L — AB (ref 20.0–24.0)
FIO2: 0.35 %
O2 Saturation: 99.7 %
PATIENT TEMPERATURE: 98.6
PCO2 ART: 36.3 mmHg (ref 35.0–45.0)
PO2 ART: 173 mmHg — AB (ref 80.0–100.0)
TCO2: 31.9 mmol/L (ref 0–100)
pH, Arterial: 7.537 — ABNORMAL HIGH (ref 7.350–7.450)

## 2014-05-14 LAB — CULTURE, RESPIRATORY

## 2014-05-14 LAB — CULTURE, RESPIRATORY W GRAM STAIN

## 2014-05-15 DIAGNOSIS — I5033 Acute on chronic diastolic (congestive) heart failure: Secondary | ICD-10-CM

## 2014-05-15 LAB — CBC
HEMATOCRIT: 32.8 % — AB (ref 39.0–52.0)
HEMOGLOBIN: 10.3 g/dL — AB (ref 13.0–17.0)
MCH: 29.9 pg (ref 26.0–34.0)
MCHC: 31.4 g/dL (ref 30.0–36.0)
MCV: 95.1 fL (ref 78.0–100.0)
Platelets: 209 10*3/uL (ref 150–400)
RBC: 3.45 MIL/uL — ABNORMAL LOW (ref 4.22–5.81)
RDW: 14.5 % (ref 11.5–15.5)
WBC: 9.7 10*3/uL (ref 4.0–10.5)

## 2014-05-15 LAB — BASIC METABOLIC PANEL
BUN: 20 mg/dL (ref 6–23)
CHLORIDE: 91 meq/L — AB (ref 96–112)
CO2: 35 mEq/L — ABNORMAL HIGH (ref 19–32)
Calcium: 9.3 mg/dL (ref 8.4–10.5)
Creatinine, Ser: 0.83 mg/dL (ref 0.50–1.35)
GFR calc non Af Amer: >90 mL/min (ref >90)
GLUCOSE: 156 mg/dL — AB (ref 70–99)
POTASSIUM: 4.2 meq/L (ref 3.7–5.3)
Sodium: 136 mEq/L — ABNORMAL LOW (ref 137–147)

## 2014-05-15 NOTE — Progress Notes (Signed)
PULMONARY / CRITICAL CARE MEDICINE  Name: Orvall Shinabarger MRN: [id number] DOB: 1951/12/04    ADMISSION DATE:  04/25/2014 CONSULTATION DATE:  04/27/2014  REFERRING MD :  hijazi PRIMARY SERVICE:  ssh  CHIEF COMPLAINT:  Vent weaning   BRIEF PATIENT DESCRIPTION: 63 yo with COPD and seizure disorder transferred to [facility] on 5/2 for liberation from mechanical ventilation. He was initially transferred from SNF to [facility] in status epilepticus. His course was complicated by MRSA tracheobronchitis / HCAP, MRSA bacteremia, AECOPD, and C diff infection. He was extubated initially but required re-intubation for recurrent respiratory distress and ultimately required trach and PEG placement.  INTERVAL HISTORY :  No issues since last seen.  Weaning well.  TTE completed.  VITAL SIGNS: Reviewed  PHYSICAL EXAMINATION: General:  Comfortable, no distress Neuro:  Awake, follows commands  HEENT:  Tracheostomy clean Cardiovascular:  Regular, no murmurs Lungs:  Bilateral air entry, no added sounds Abdomen:  Soft, non-tender Musculoskeletal:  Intact  Skin:  Intact   LABS: CBC  Recent Labs Lab 05/09/14 0500 05/11/14 0500 05/15/14 0804  WBC 16.5* 11.9* 9.7  HGB 10.0* 9.2* 10.3*  HCT 32.2* 29.4* 32.8*  PLT 225 204 209   Coag's No results found for this basename: APTT, INR,  in the last 168 hours BMET  Recent Labs Lab 05/09/14 0500 05/11/14 0500 05/15/14 0804  NA 141 139 136*  K 3.6* 4.3 4.2  CL 98 96 91*  CO2 34* 35* 35*  BUN 15 18 20   CREATININE 0.83 0.82 0.83  GLUCOSE 111* 119* 156*   Electrolytes  Recent Labs Lab 05/09/14 0500 05/11/14 0500 05/15/14 0804  CALCIUM 9.4 8.9 9.3   Sepsis Markers  Recent Labs Lab 05/11/14 0500  PROCALCITON 0.28   ABG  Recent Labs Lab 05/14/14 1500  PHART 7.537*  PCO2ART 36.3  PO2ART 173.0*   Liver Enzymes No results found for this basename: AST, ALT, ALKPHOS, BILITOT, ALBUMIN,  in the last 168 hours Cardiac Enzymes No  results found for this basename: TROPONINI, PROBNP,  in the last 168 hours Glucose No results found for this basename: GLUCAP,  in the last 168 hours  IMAGING:  Dg Chest Port 1 View  05/14/2014   CLINICAL DATA:  Congestion, cough, question infiltrate  EXAM: PORTABLE CHEST - 1 VIEW  COMPARISON:  Portable exam 1506 hr compared to 05/11/2014  FINDINGS: Tracheostomy tube projects over tracheal air column with tip 7.4 cm above carinal.  LEFT arm PICC line tip projects over mid SVC.  Normal heart size, mediastinal contours, and pulmonary vascularity.  Lungs appear hyperinflated but clear.  No infiltrate, pleural effusion or pneumothorax.  Diffuse osseous demineralization.  Old healed fracture of the LEFT humeral diaphysis noted.  IMPRESSION: No acute infiltrates.   Electronically Signed   By: Ulyses Southward M.D.   On: 05/14/2014 16:30    ASSESSMENT / PLAN:  Acute on chronic respiratory failure, not clearly explained by pulmonary pathology alone COPD, no evidence of acute bronchospasm Acute on chronic diastolic dysfunction No echo evidence of RWMA Pulmonary edema Tracheostomy status  Seizure disorder Acute encephalopathy / delirium   Weaning per protocol  Bronchodilators  No indication for systemic steroids  Lasix for negative fluid balance  I have personally obtained history, examined patient, evaluated and interpreted laboratory and imaging results, reviewed medical records, formulated assessment / plan and placed orders.  Lonia Farber, MD Pulmonary and Critical Care Medicine [facility] Pager: [phone]  05/15/2014, 10:32 AM

## 2014-05-18 LAB — CBC
HEMATOCRIT: 31.4 % — AB (ref 39.0–52.0)
HEMOGLOBIN: 10.3 g/dL — AB (ref 13.0–17.0)
MCH: 30.4 pg (ref 26.0–34.0)
MCHC: 32.8 g/dL (ref 30.0–36.0)
MCV: 92.6 fL (ref 78.0–100.0)
Platelets: 261 10*3/uL (ref 150–400)
RBC: 3.39 MIL/uL — ABNORMAL LOW (ref 4.22–5.81)
RDW: 14.2 % (ref 11.5–15.5)
WBC: 10.1 10*3/uL (ref 4.0–10.5)

## 2014-05-18 LAB — BASIC METABOLIC PANEL
BUN: 20 mg/dL (ref 6–23)
CHLORIDE: 93 meq/L — AB (ref 96–112)
CO2: 32 meq/L (ref 19–32)
Calcium: 9.6 mg/dL (ref 8.4–10.5)
Creatinine, Ser: 1.03 mg/dL (ref 0.50–1.35)
GFR calc non Af Amer: 75 mL/min — ABNORMAL LOW (ref >90)
GFR, EST AFRICAN AMERICAN: 87 mL/min — AB (ref >90)
Glucose, Bld: 154 mg/dL — ABNORMAL HIGH (ref 70–99)
Potassium: 4.3 mEq/L (ref 3.7–5.3)
Sodium: 135 mEq/L — ABNORMAL LOW (ref 137–147)

## 2014-05-18 NOTE — Progress Notes (Signed)
[facility]                                                                                              Progress note     Patient Demographics  Lucas Glover, is a 63 y.o. male  ZOX:[id number]  WJX:[id number]  DOB - 01-05-1951  Admit date - 04/25/2014  Admitting Physician Carron CurieAli Gaynell Eggleton, MD  Outpatient Primary MD for the patient is PROVIDER NOT IN SYSTEM  LOS - 23   Chief complaint   Respiratory failure         Subjective:   Lucas RusselJames Belgrave has no new complaints  Objective:   Vital signs  Temperature 97.8 Heart rate 87 Respiratory rate 20 Blood pressure 122/71 Pulse ox 98 %    Exam Awake Alert, confused, No new F.N deficits, [place].AT,PERRAL Supple Neck,No JVD, No cervical lymphadenopathy appreciated.  Tracheostomy midline Symmetrical Chest wall movement, decreased breath sounds bilaterally,  RRR,No Gallops,Rubs or new Murmurs, No Parasternal Heave +ve B.Sounds, Abd Soft, Non tender, No organomegaly appriciated, No rebound - guarding or rigidity. PEG tube noted No Cyanosis, Clubbing or edema, No new Rash or bruise  /left   I&Os 1780/?? Peg Tube yes Trach #8 Shiley  Data Review   CBC  Recent Labs Lab 05/15/14 0804 05/18/14 0340  WBC 9.7 10.1  HGB 10.3* 10.3*  HCT 32.8* 31.4*  PLT 209 261  MCV 95.1 92.6  MCH 29.9 30.4  MCHC 31.4 32.8  RDW 14.5 14.2    Chemistries   Recent Labs Lab 05/15/14 0804 05/18/14 0340  NA 136* 135*  K 4.2 4.3  CL 91* 93*  CO2 35* 32  GLUCOSE 156* 154*  BUN 20 20  CREATININE 0.83 1.03  CALCIUM 9.3 9.6   ------------------------------------------------------------------------------------------------------------------ CrCl is unknown because there is no height on file for the current visit. ------------------------------------------------------------------------------------------------------------------ No results found for this basename:  HGBA1C,  in the last 72 hours ------------------------------------------------------------------------------------------------------------------ No results found for this basename: CHOL, HDL, LDLCALC, TRIG, CHOLHDL, LDLDIRECT,  in the last 72 hours ------------------------------------------------------------------------------------------------------------------ No results found for this basename: TSH, T4TOTAL, FREET3, T3FREE, THYROIDAB,  in the last 72 hours ------------------------------------------------------------------------------------------------------------------ No results found for this basename: VITAMINB12, FOLATE, FERRITIN, TIBC, IRON, RETICCTPCT,  in the last 72 hours  Coagulation profile No results found for this basename: INR, PROTIME,  in the last 168 hours  No results found for this basename: DDIMER,  in the last 72 hours  Cardiac Enzymes No results found for this basename: CK, CKMB, TROPONINI, MYOGLOBIN,  in the last 168 hours ------------------------------------------------------------------------------------------------------------------ No components found with this basename: POCBNP,   Micro Results Recent Results (from the past 240 hour(s))  CULTURE, RESPIRATORY (NON-EXPECTORATED)     Status: None   Collection Time    05/10/14  2:01 PM      Result Value Ref Range Status   Specimen Description TRACHEAL ASPIRATE   Final   Special Requests NONE   Final   Gram Stain     Final   Value: MODERATE WBC PRESENT, PREDOMINANTLY PMN     NO SQUAMOUS EPITHELIAL CELLS SEEN     RARE  GRAM POSITIVE COCCI     IN CLUSTERS     Performed at [facility]   Culture     Final   Value: MODERATE ESCHERICHIA COLI     Note: Confirmed Extended Spectrum Beta-Lactamase Producer (ESBL) CRITICAL RESULT CALLED TO, READ BACK BY AND VERIFIED WITH: DONALD S. @855AM  5.21.15 BY BUONO     Performed at [facility]   Report Status 05/14/2014 FINAL   Final   Organism ID, Bacteria  ESCHERICHIA COLI   Final     Chest x-ray on 5/16 shows no acute changes  Assessment & Plan    Respiratory failure, ventilator dependent;  still needing vent  continue with PMD trials COPD on neb treatments Mersa pneumonia off IV vancomycin , chest x-ray shows no acute changes Seizures on Keppra, controlled C. difficile off Flagyl Hypertension controlled Dementia /agitation continue with Seroquel/Klonopin History of abdominal aortic aneurysm-chronic to follow as outpatient Anemia Will monitor hemoglobin Depression start Zoloft Protein calorie malnutrition on Vital Renal mass follow as outpatient Generalized weakness; PT OT as tolerated Low TSH probably sick euthyroid syndrome; Mild pulmonary edema; continue with Lasix Leukocytosis check urinalysis and decrease prednisone to 10 mg daily  Plan  Check chest x-ray in a.m.  CODE STATUS full   DVT Prophylaxis  Lovenox   Carron Curie M.D on 05/18/2014 at 12:11 PM

## 2014-05-19 ENCOUNTER — Other Ambulatory Visit (HOSPITAL_COMMUNITY): Payer: Medicare Other

## 2014-05-19 ENCOUNTER — Institutional Professional Consult (permissible substitution) (HOSPITAL_COMMUNITY): Payer: Medicare Other

## 2014-05-19 NOTE — Progress Notes (Signed)
[facility]                                                                                              Progress note     Patient Demographics  Lucas Glover, is a 63 y.o. male  ZOX:[id number]  WJX:[id number]  DOB - [birth year]  Admit date - 04/25/2014  Admitting Physician Carron CurieAli Skiler Olden, MD  Outpatient Primary MD for the patient is PROVIDER NOT IN SYSTEM  LOS - 24   Chief complaint   Respiratory failure         Subjective:   Lucas Glover has no new complaints  Objective:   Vital signs  Temperature 96.8 Heart rate 79 Respiratory rate 20 Blood pressure 107/78 Pulse ox 99 %    Exam Awake Alert, confused, No new F.N deficits, [place].AT,PERRAL Supple Neck,No JVD, No cervical lymphadenopathy appreciated.  Tracheostomy midline Symmetrical Chest wall movement, decreased breath sounds bilaterally,  RRR,No Gallops,Rubs or new Murmurs, No Parasternal Heave +ve B.Sounds, Abd Soft, Non tender, No organomegaly appriciated, No rebound - guarding or rigidity. PEG tube noted No Cyanosis, Clubbing or edema, No new Rash or bruise  /left   I&Os 1668/?? Peg Tube yes Trach #8 Shiley  Data Review   CBC  Recent Labs Lab 05/15/14 0804 05/18/14 0340  WBC 9.7 10.1  HGB 10.3* 10.3*  HCT 32.8* 31.4*  PLT 209 261  MCV 95.1 92.6  MCH 29.9 30.4  MCHC 31.4 32.8  RDW 14.5 14.2    Chemistries   Recent Labs Lab 05/15/14 0804 05/18/14 0340  NA 136* 135*  K 4.2 4.3  CL 91* 93*  CO2 35* 32  GLUCOSE 156* 154*  BUN 20 20  CREATININE 0.83 1.03  CALCIUM 9.3 9.6   ------------------------------------------------------------------------------------------------------------------ CrCl is unknown because there is no height on file for the current visit. ------------------------------------------------------------------------------------------------------------------ No results found for this basename:  HGBA1C,  in the last 72 hours ------------------------------------------------------------------------------------------------------------------ No results found for this basename: CHOL, HDL, LDLCALC, TRIG, CHOLHDL, LDLDIRECT,  in the last 72 hours ------------------------------------------------------------------------------------------------------------------ No results found for this basename: TSH, T4TOTAL, FREET3, T3FREE, THYROIDAB,  in the last 72 hours ------------------------------------------------------------------------------------------------------------------ No results found for this basename: VITAMINB12, FOLATE, FERRITIN, TIBC, IRON, RETICCTPCT,  in the last 72 hours  Coagulation profile No results found for this basename: INR, PROTIME,  in the last 168 hours  No results found for this basename: DDIMER,  in the last 72 hours  Cardiac Enzymes No results found for this basename: CK, CKMB, TROPONINI, MYOGLOBIN,  in the last 168 hours ------------------------------------------------------------------------------------------------------------------ No components found with this basename: POCBNP,   Micro Results Recent Results (from the past 240 hour(s))  CULTURE, RESPIRATORY (NON-EXPECTORATED)     Status: None   Collection Time    05/10/14  2:01 PM      Result Value Ref Range Status   Specimen Description TRACHEAL ASPIRATE   Final   Special Requests NONE   Final   Gram Stain     Final   Value: MODERATE WBC PRESENT, PREDOMINANTLY PMN     NO SQUAMOUS EPITHELIAL CELLS SEEN     RARE  GRAM POSITIVE COCCI     IN CLUSTERS     Performed at [facility]   Culture     Final   Value: MODERATE ESCHERICHIA COLI     Note: Confirmed Extended Spectrum Beta-Lactamase Producer (ESBL) CRITICAL RESULT CALLED TO, READ BACK BY AND VERIFIED WITH: DONALD S. @855AM  5.21.15 BY BUONO     Performed at [facility]   Report Status 05/14/2014 FINAL   Final   Organism ID, Bacteria  ESCHERICHIA COLI   Final     Chest x-ray on 5/16 shows no acute changes  Assessment & Plan    Respiratory failure, ventilator dependent;  still needing vent  even more at this time. Failing weaning COPD on neb treatments Mersa pneumonia off IV vancomycin ,  Seizures on Keppra, controlled C. difficile off Flagyl Hypertension controlled Dementia /agitation continue with Seroquel/Klonopin History of abdominal aortic aneurysm-chronic to follow as outpatient Anemia Will monitor hemoglobin Depression continue with Zoloft Protein calorie malnutrition on Vital Renal mass follow as outpatient Generalized weakness; PT OT as tolerated Low TSH probably sick euthyroid syndrome versus subclinical hyperthyroidism because of normal T3 and T4 Mild pulmonary edema; continue with Lasix Tracheitis/ESBL  Plan Start Solu-Medrol Continue nebulizer treatments Check thyroid scan Start meropenem Critical care time; 36 minutes   CODE STATUS full   DVT Prophylaxis  Lovenox   Carron Curie M.D on 05/19/2014 at 11:37 AM

## 2014-05-19 NOTE — Progress Notes (Addendum)
PULMONARY / CRITICAL CARE MEDICINE  Name: Lucas Glover MRN: [id number] DOB: Jul 13, 1951    ADMISSION DATE:  04/25/2014 CONSULTATION DATE:  04/27/2014  REFERRING MD :  hijazi PRIMARY SERVICE:  ssh  CHIEF COMPLAINT:  Vent weaning   BRIEF PATIENT DESCRIPTION: 63 yo with COPD and seizure disorder [facility] from mechanical ventilation. [facility]. His course was complicated by MRSA tracheobronchitis / HCAP, MRSA bacteremia, AECOPD, and C diff infection. He was extubated initially but required re-intubation for recurrent respiratory distress and ultimately required trach and PEG placement.  INTERVAL HISTORY :  No issues since last seen.  Weaning well.  TTE completed.  VITAL SIGNS: Reviewed  PHYSICAL EXAMINATION: General:  Comfortable, no distress Neuro:  Awake, follows commands  HEENT:  Tracheostomy clean Cardiovascular:  Regular, no murmurs Lungs:  Bilateral air entry, occ rhonchi  Abdomen:  Soft, non-tender Musculoskeletal:  Intact  Skin:  Intact   LABS: CBC  Recent Labs Lab 05/15/14 0804 05/18/14 0340  WBC 9.7 10.1  HGB 10.3* 10.3*  HCT 32.8* 31.4*  PLT 209 261   Coag's No results found for this basename: APTT, INR,  in the last 168 hours BMET  Recent Labs Lab 05/15/14 0804 05/18/14 0340  NA 136* 135*  K 4.2 4.3  CL 91* 93*  CO2 35* 32  BUN 20 20  CREATININE 0.83 1.03  GLUCOSE 156* 154*   Electrolytes  Recent Labs Lab 05/15/14 0804 05/18/14 0340  CALCIUM 9.3 9.6   Sepsis Markers No results found for this basename: LATICACIDVEN, PROCALCITON, O2SATVEN,  in the last 168 hours ABG  Recent Labs Lab 05/14/14 1500  PHART 7.537*  PCO2ART 36.3  PO2ART 173.0*   IMAGING:  Dg Chest Port 1 View  05/19/2014   CLINICAL DATA:  Respiratory failure  EXAM: PORTABLE CHEST - 1 VIEW  COMPARISON:  May 14, 2014  FINDINGS: The heart size and mediastinal contours  are within normal limits. Both lungs are clear. The visualized skeletal structures are unremarkable. Tracheostomy tube tip is 7.6 cm above the carina. Central catheter tip is in the superior vena cava near the cavoatrial junction. No pneumothorax. There is no edema or consolidation. The heart size and pulmonary vascularity are normal. No adenopathy. There is evidence of remodeling in the proximal left humerus from a previous fracture.  IMPRESSION: No edema or consolidation.   Electronically Signed   By: Bretta BangWilliam  Woodruff M.D.   On: 05/19/2014 08:05  no infiltrate  ASSESSMENT / PLAN:  Acute respiratory failure s/p seizure c/b MRSA PNA [facility]  COPD w/ AECOPD 5/17 in setting of E coli tracheobronchitis ESBL producer.  Tracheostomy status  Seizure d/o  Encephalopathy ? Element of underlying dementia  C-diff colitis (treated) HTN  Protein calorie malnutrition  Deconditioning  Anemia of critical illness  Depression  Sick euthyroid LIkely  - [facility]  Discussion  Failing weaning trials. Sputum w/ e-coli ESBL producer. Primary service concerned about possible subclinical hyperthyroidism. Suspect that AECOPD and bronchitis is bigger issue Plan  Weaning per protocol  Bronchodilators  Steroid taper  Meropenem per primary svc day 0/X on 5/26  Lasix for negative fluid balance  Any hint of ATX on pcxr = bronch with esbl and poor weaning efforts, high risk mucous plugging  pcxr in am , follow clinica course   I have personally obtained history, examined patient, evaluated and interpreted laboratory and imaging results,  reviewed medical records, formulated assessment / plan and placed orders.  Simonne Martinet, MD Pulmonary and Critical Care Medicine [facility] Pager: [phone]  05/19/2014, 1:33 PM  I have fully examined this patient and agree with above findings.    And edited in full  Mcarthur Rossetti. Tyson Alias, MD, FACP Pgr: [phone] [place] [facility]  [facility]

## 2014-05-20 NOTE — Progress Notes (Signed)
[facility]                                                                                              Progress note     Patient Demographics  Lucas Glover, is a 63 y.o. male  ZOX:[id number]  WJX:[id number]  DOB - December 16, 1951  Admit date - 04/25/2014  Admitting Physician Carron CurieAli Hailie Searight, MD  Outpatient Primary MD for the patient is PROVIDER NOT IN SYSTEM  LOS - 25   Chief complaint   Respiratory failure         Subjective:   Lucas Glover has no new complaints  Objective:   Vital signs  Temperature 97.3 Heart rate 95 Respiratory rate 14 Blood pressure 114/84 Pulse ox 99 %    Exam Awake Alert, confused, No new F.N deficits, [place].AT,PERRAL Supple Neck,No JVD, No cervical lymphadenopathy appreciated.  Tracheostomy midline Symmetrical Chest wall movement, decreased breath sounds bilaterally,  RRR,No Gallops,Rubs or new Murmurs, No Parasternal Heave +ve B.Sounds, Abd Soft, Non tender, No organomegaly appriciated, No rebound - guarding or rigidity. PEG tube noted No Cyanosis, Clubbing or edema, No new Rash or bruise  /left   I&Os 2860/?? Peg Tube yes Trach #8 Shiley  Data Review   CBC  Recent Labs Lab 05/15/14 0804 05/18/14 0340  WBC 9.7 10.1  HGB 10.3* 10.3*  HCT 32.8* 31.4*  PLT 209 261  MCV 95.1 92.6  MCH 29.9 30.4  MCHC 31.4 32.8  RDW 14.5 14.2    Chemistries   Recent Labs Lab 05/15/14 0804 05/18/14 0340  NA 136* 135*  K 4.2 4.3  CL 91* 93*  CO2 35* 32  GLUCOSE 156* 154*  BUN 20 20  CREATININE 0.83 1.03  CALCIUM 9.3 9.6   ------------------------------------------------------------------------------------------------------------------ CrCl is unknown because there is no height on file for the current visit. ------------------------------------------------------------------------------------------------------------------ No results found for this basename:  HGBA1C,  in the last 72 hours ------------------------------------------------------------------------------------------------------------------ No results found for this basename: CHOL, HDL, LDLCALC, TRIG, CHOLHDL, LDLDIRECT,  in the last 72 hours ------------------------------------------------------------------------------------------------------------------ No results found for this basename: TSH, T4TOTAL, FREET3, T3FREE, THYROIDAB,  in the last 72 hours ------------------------------------------------------------------------------------------------------------------ No results found for this basename: VITAMINB12, FOLATE, FERRITIN, TIBC, IRON, RETICCTPCT,  in the last 72 hours  Coagulation profile No results found for this basename: INR, PROTIME,  in the last 168 hours  No results found for this basename: DDIMER,  in the last 72 hours  Cardiac Enzymes No results found for this basename: CK, CKMB, TROPONINI, MYOGLOBIN,  in the last 168 hours ------------------------------------------------------------------------------------------------------------------ No components found with this basename: POCBNP,   Micro Results Recent Results (from the past 240 hour(s))  CULTURE, RESPIRATORY (NON-EXPECTORATED)     Status: None   Collection Time    05/10/14  2:01 PM      Result Value Ref Range Status   Specimen Description TRACHEAL ASPIRATE   Final   Special Requests NONE   Final   Gram Stain     Final   Value: MODERATE WBC PRESENT, PREDOMINANTLY PMN     NO SQUAMOUS EPITHELIAL CELLS SEEN     RARE  GRAM POSITIVE COCCI     IN CLUSTERS     Performed at [facility]   Culture     Final   Value: MODERATE ESCHERICHIA COLI     Note: Confirmed Extended Spectrum Beta-Lactamase Producer (ESBL) CRITICAL RESULT CALLED TO, READ BACK BY AND VERIFIED WITH: DONALD S. @855AM  5.21.15 BY BUONO     Performed at [facility]   Report Status 05/14/2014 FINAL   Final   Organism ID, Bacteria  ESCHERICHIA COLI   Final     Chest x-ray on 5/16 shows no acute changes  Assessment & Plan    Respiratory failure, ventilator dependent;  still needing vent  even more at this time. Failing weaning COPD on neb treatments Mersa pneumonia off IV vancomycin ,  Seizures on Keppra, controlled C. difficile off Flagyl Hypertension controlled Dementia /agitation continue with Seroquel/Klonopin History of abdominal aortic aneurysm-chronic to follow as outpatient Anemia Will monitor hemoglobin Depression continue with Zoloft Protein calorie malnutrition on Vital Renal mass follow as outpatient Generalized weakness; PT OT as tolerated Low TSH probably sick euthyroid syndrome versus subclinical hyperthyroidism because of normal T3 and T4 Mild pulmonary edema; continue with Lasix Tracheitis/ESBL on meropenem  Plan Continue with corticosteroids and nebulizer treatments C/w IV Abxs CODE STATUS full   DVT Prophylaxis  Lovenox   Carron Curie M.D on 05/20/2014 at 12:05 PM

## 2014-05-21 ENCOUNTER — Other Ambulatory Visit (HOSPITAL_COMMUNITY): Payer: Medicare Other

## 2014-05-21 ENCOUNTER — Institutional Professional Consult (permissible substitution) (HOSPITAL_COMMUNITY): Payer: Medicare Other

## 2014-05-21 LAB — BASIC METABOLIC PANEL
BUN: 31 mg/dL — ABNORMAL HIGH (ref 6–23)
CHLORIDE: 92 meq/L — AB (ref 96–112)
CO2: 31 mEq/L (ref 19–32)
Calcium: 9.7 mg/dL (ref 8.4–10.5)
Creatinine, Ser: 0.92 mg/dL (ref 0.50–1.35)
GFR calc Af Amer: >90 mL/min (ref >90)
GFR, EST NON AFRICAN AMERICAN: 87 mL/min — AB (ref >90)
Glucose, Bld: 173 mg/dL — ABNORMAL HIGH (ref 70–99)
Potassium: 4.6 mEq/L (ref 3.7–5.3)
SODIUM: 134 meq/L — AB (ref 137–147)

## 2014-05-21 LAB — CBC
HCT: 27.5 % — ABNORMAL LOW (ref 39.0–52.0)
HEMOGLOBIN: 9.4 g/dL — AB (ref 13.0–17.0)
MCH: 30.9 pg (ref 26.0–34.0)
MCHC: 34.2 g/dL (ref 30.0–36.0)
MCV: 90.5 fL (ref 78.0–100.0)
Platelets: 236 10*3/uL (ref 150–400)
RBC: 3.04 MIL/uL — AB (ref 4.22–5.81)
RDW: 14.2 % (ref 11.5–15.5)
WBC: 16.2 10*3/uL — ABNORMAL HIGH (ref 4.0–10.5)

## 2014-05-21 NOTE — Progress Notes (Signed)
PULMONARY / CRITICAL CARE MEDICINE  Name: Lucas Glover MRN: [id number] DOB: 02-05-1951    ADMISSION DATE:  04/25/2014 CONSULTATION DATE:  04/27/2014  REFERRING MD :  hijazi PRIMARY SERVICE:  ssh  CHIEF COMPLAINT:  Vent weaning   BRIEF PATIENT DESCRIPTION: 63 yo with COPD and seizure disorder [facility]. [facility]. His course was complicated by MRSA tracheobronchitis / HCAP, MRSA bacteremia, AECOPD, and C diff infection. He was extubated initially but required re-intubation for recurrent respiratory distress and ultimately required trach and PEG placement.  INTERVAL HISTORY : FOB per Dr. Tyson AliasFeinstein   VITAL SIGNS: Vital signs reviewed. Abnormal values will appear under impression plan section.    PHYSICAL EXAMINATION: General:  Comfortable, no distress, curled in fetal postion, high secretions Neuro:  Awake, follows commands  HEENT:  Tracheostomy clean Cardiovascular:  Regular, no murmurs Lungs:  Bilateral air entry, occ rhonchi , diminished in bases  Abdomen:  Soft, non-tender Musculoskeletal:  Intact  Skin:  Intact   LABS: CBC  Recent Labs Lab 05/15/14 0804 05/18/14 0340 05/21/14 0658  WBC 9.7 10.1 16.2*  HGB 10.3* 10.3* 9.4*  HCT 32.8* 31.4* 27.5*  PLT 209 261 236   Coag's No results found for this basename: APTT, INR,  in the last 168 hours BMET  Recent Labs Lab 05/15/14 0804 05/18/14 0340 05/21/14 0658  NA 136* 135* 134*  K 4.2 4.3 4.6  CL 91* 93* 92*  CO2 35* 32 31  BUN 20 20 31*  CREATININE 0.83 1.03 0.92  GLUCOSE 156* 154* 173*   Electrolytes  Recent Labs Lab 05/15/14 0804 05/18/14 0340 05/21/14 0658  CALCIUM 9.3 9.6 9.7   Sepsis Markers No results found for this basename: LATICACIDVEN, PROCALCITON, O2SATVEN,  in the last 168 hours ABG  Recent Labs Lab 05/14/14 1500  PHART 7.537*  PCO2ART 36.3  PO2ART  173.0*   IMAGING:  Dg Chest Port 1 View  05/21/2014   CLINICAL DATA:  Respiratory failure  EXAM: PORTABLE CHEST - 1 VIEW  COMPARISON:  05/19/2014  FINDINGS: The heart size and vascular pattern are normal. Tracheostomy tube is in unchanged position as is left-sided central line. No consolidation effusion or pneumothorax.  IMPRESSION: No acute abnormalities   Electronically Signed   By: Esperanza Heiraymond  Rubner M.D.   On: 05/21/2014 08:10  no infiltrate  ASSESSMENT / PLAN:  Acute respiratory failure s/p seizure c/b [facility] P[facility]  COPD w/ AECOPD 5/17 in setting of E coli tracheobronchitis ESBL producer.  Tracheostomy status  Seizure d/o  Encephalopathy ? Element of underlying dementia  C-diff colitis (treated) HTN  Protein calorie malnutrition  Deconditioning  Anemia of critical illness  Depression  Sick euthyroid LIkely  [facility]  Discussion  Failing weaning trials. Sputum w/ e-coli ESBL producer. Primary service concerned about possible subclinical hyperthyroidism. Suspect that AECOPD and bronchitis is bigger issue Plan  Weaning per protocol, attempts at PS 12 today again  Bronchodilators  Steroid taper  Meropenem per primary svc   Lasix for negative fluid balance  FOB 5/28 to examine airways and rule out obstruction / collapse as cause of failure to wean with such significant secretions, if present may consider inhaled abx, etc, mucomyst  Steve Minor ACNP [facility] Pager [phone] till 3 pm If no answer page [phone] 05/21/2014, 1:23 PM    I have personally obtained history, examined patient,  evaluated and interpreted laboratory and imaging results, reviewed medical records, formulated assessment / plan and placed orders.  Mcarthur Rossetti. Tyson Alias, MD, FACP Pgr: [phone] [place] [facility]

## 2014-05-21 NOTE — Procedures (Signed)
Bronchoscopy Procedure Note Goldman Cullom [id number] [birth year]  Procedure: Bronchoscopy Indications: Diagnostic evaluation of the airways, Obtain specimens for culture and/or other diagnostic studies and Remove secretions  Procedure Details Consent: Risks of procedure as well as the alternatives and risks of each were explained to the (patient/caregiver).  Consent for procedure obtained. Time Out: Verified patient identification, verified procedure, site/side was marked, verified correct patient position, special equipment/implants available, medications/allergies/relevent history reviewed, required imaging and test results available.  Performed  In preparation for procedure, patient was given 100% FiO2 and bronchoscope lubricated. Sedation: Etomidate  Airway entered and the following bronchi were examined: RUL, RML, RLL, LUL, LLL and Bronchi.   Procedures performed: Brushings performed Bronchoscope removed.    Evaluation Hemodynamic Status: BP stable throughout; O2 sats: stable throughout Patient's Current Condition: stable Specimens:  Sent purulent fluid Complications: No apparent complications Patient did tolerate procedure well.   Nelda Bucks 05/21/2014  Failure to wean , esbl, high secretions , r/o collapse as contribnuter  1. Pus obstructing superior segment RLL, LLL, mild, suctioned (not a contributor to failed weaning) 2. BAL (additional organisms?) sup seg rll  Mcarthur Rossetti. Tyson Alias, MD, FACP Pgr: [phone] [place] [facility]

## 2014-05-21 NOTE — Progress Notes (Signed)
[facility]                                                                                              Progress note     Patient Demographics  Lucas Glover, is a 63 y.o. male  UJW:[id number]  FAO:[id number]  DOB - 10-06-1951  Admit date - 04/25/2014  Admitting Physician Carron CurieAli Nechemia Chiappetta, MD  Outpatient Primary MD for the patient is PROVIDER NOT IN SYSTEM  LOS - 26   Chief complaint   Respiratory failure         Subjective:   Lucas Glover has no new complaints  Objective:   Vital signs  Temperature 97.1 Heart rate 73 Respiratory rate 22 Blood pressure 114/79 Pulse ox 100 %    Exam Awake Alert, confused, No new F.N deficits, [place].AT,PERRAL Supple Neck,No JVD, No cervical lymphadenopathy appreciated.  Tracheostomy midline Symmetrical Chest wall movement, decreased breath sounds bilaterally,  RRR,No Gallops,Rubs or new Murmurs, No Parasternal Heave +ve B.Sounds, Abd Soft, Non tender, No organomegaly appriciated, No rebound - guarding or rigidity. PEG tube noted No Cyanosis, Clubbing or edema, No new Rash or bruise  /left   I&Os 2703/?? Peg Tube yes Trach #8 Shiley  Data Review   CBC  Recent Labs Lab 05/15/14 0804 05/18/14 0340 05/21/14 0658  WBC 9.7 10.1 16.2*  HGB 10.3* 10.3* 9.4*  HCT 32.8* 31.4* 27.5*  PLT 209 261 236  MCV 95.1 92.6 90.5  MCH 29.9 30.4 30.9  MCHC 31.4 32.8 34.2  RDW 14.5 14.2 14.2    Chemistries   Recent Labs Lab 05/15/14 0804 05/18/14 0340 05/21/14 0658  NA 136* 135* 134*  K 4.2 4.3 4.6  CL 91* 93* 92*  CO2 35* 32 31  GLUCOSE 156* 154* 173*  BUN 20 20 31*  CREATININE 0.83 1.03 0.92  CALCIUM 9.3 9.6 9.7   ------------------------------------------------------------------------------------------------------------------ CrCl is unknown because there is no height on file for the current  visit. ------------------------------------------------------------------------------------------------------------------ No results found for this basename: HGBA1C,  in the last 72 hours ------------------------------------------------------------------------------------------------------------------ No results found for this basename: CHOL, HDL, LDLCALC, TRIG, CHOLHDL, LDLDIRECT,  in the last 72 hours ------------------------------------------------------------------------------------------------------------------ No results found for this basename: TSH, T4TOTAL, FREET3, T3FREE, THYROIDAB,  in the last 72 hours ------------------------------------------------------------------------------------------------------------------ No results found for this basename: VITAMINB12, FOLATE, FERRITIN, TIBC, IRON, RETICCTPCT,  in the last 72 hours  Coagulation profile No results found for this basename: INR, PROTIME,  in the last 168 hours  No results found for this basename: DDIMER,  in the last 72 hours  Cardiac Enzymes No results found for this basename: CK, CKMB, TROPONINI, MYOGLOBIN,  in the last 168 hours ------------------------------------------------------------------------------------------------------------------ No components found with this basename: POCBNP,   Micro Results No results found for this or any previous visit (from the past 240 hour(s)).   Chest x-ray on 5/16 shows no acute changes  Assessment & Plan    Respiratory failure, ventilator dependent;  still needing vent  even more at this time. Failing weaning. Increased mucous plugs COPD on neb treatments Mersa pneumonia off IV vancomycin ,  Seizures on Keppra, controlled C.  difficile off Flagyl Hypertension controlled Dementia /agitation continue with Seroquel/Klonopin History of abdominal aortic aneurysm-chronic to follow as outpatient Anemia Will monitor hemoglobin Depression continue with Zoloft Protein calorie  malnutrition on Vital Renal mass follow as outpatient Generalized weakness; PT OT as tolerated Low TSH probably sick euthyroid syndrome versus subclinical hyperthyroidism because of normal T3 and T4 Mild pulmonary edema; continue with Lasix Tracheitis/ESBL on meropenem  Plan Bronchoscopy per PCCM Continue with corticosteroids and nebulizer treatments C/w IV Abxs CODE STATUS full   DVT Prophylaxis  Lovenox   Carron Curie M.D on 05/21/2014 at 1:44 PM

## 2014-05-22 ENCOUNTER — Institutional Professional Consult (permissible substitution) (HOSPITAL_COMMUNITY): Payer: Medicare Other

## 2014-05-22 LAB — CBC
HEMATOCRIT: 29.2 % — AB (ref 39.0–52.0)
Hemoglobin: 9.6 g/dL — ABNORMAL LOW (ref 13.0–17.0)
MCH: 30.3 pg (ref 26.0–34.0)
MCHC: 32.9 g/dL (ref 30.0–36.0)
MCV: 92.1 fL (ref 78.0–100.0)
Platelets: 276 10*3/uL (ref 150–400)
RBC: 3.17 MIL/uL — ABNORMAL LOW (ref 4.22–5.81)
RDW: 14.1 % (ref 11.5–15.5)
WBC: 17.3 10*3/uL — AB (ref 4.0–10.5)

## 2014-05-22 LAB — BLOOD GAS, ARTERIAL
Acid-Base Excess: 9.4 mmol/L — ABNORMAL HIGH (ref 0.0–2.0)
Bicarbonate: 34.6 mEq/L — ABNORMAL HIGH (ref 20.0–24.0)
FIO2: 0.35 %
O2 Saturation: 100 %
PCO2 ART: 59.7 mmHg — AB (ref 35.0–45.0)
PEEP: 5 cmH2O
PH ART: 7.382 (ref 7.350–7.450)
Patient temperature: 98.6
RATE: 20 resp/min
TCO2: 36.5 mmol/L (ref 0–100)
VT: 450 mL
pO2, Arterial: 134 mmHg — ABNORMAL HIGH (ref 80.0–100.0)

## 2014-05-22 LAB — BASIC METABOLIC PANEL
BUN: 33 mg/dL — AB (ref 6–23)
CO2: 33 mEq/L — ABNORMAL HIGH (ref 19–32)
CREATININE: 0.92 mg/dL (ref 0.50–1.35)
Calcium: 9.7 mg/dL (ref 8.4–10.5)
Chloride: 93 mEq/L — ABNORMAL LOW (ref 96–112)
GFR calc non Af Amer: 87 mL/min — ABNORMAL LOW (ref >90)
Glucose, Bld: 116 mg/dL — ABNORMAL HIGH (ref 70–99)
Potassium: 5 mEq/L (ref 3.7–5.3)
Sodium: 138 mEq/L (ref 137–147)

## 2014-05-22 NOTE — Progress Notes (Signed)
[facility]                                                                                              Progress note     Patient Demographics  Lucas Glover, is a 63 y.o. male  ZOX:[id number]  WJX:[id number]  DOB - Mar 05, 1951  Admit date - 04/25/2014  Admitting Physician Carron CurieAli Atilano Covelli, MD  Outpatient Primary MD for the patient is PROVIDER NOT IN SYSTEM  LOS - 27   Chief complaint   Respiratory failure         Subjective:   Lucas RusselJames Creer has no new complaints  Objective:   Vital signs  Temperature 97.1 Heart rate 73 Respiratory rate 22 Blood pressure 108/70 Pulse ox 100 %    Exam Awake Alert, confused, No new F.N deficits, .AT,PERRAL Supple Neck,No JVD, No cervical lymphadenopathy appreciated.  Tracheostomy midline Symmetrical Chest wall movement, decreased breath sounds bilaterally,  RRR,No Gallops,Rubs or new Murmurs, No Parasternal Heave +ve B.Sounds, Abd Soft, Non tender, No organomegaly appriciated, No rebound - guarding or rigidity. PEG tube noted No Cyanosis, Clubbing or edema, No new Rash or bruise  /left   I&Os unknown  Peg Tube yes Trach #8 Shiley  Data Review   CBC  Recent Labs Lab 05/18/14 0340 05/21/14 0658  WBC 10.1 16.2*  HGB 10.3* 9.4*  HCT 31.4* 27.5*  PLT 261 236  MCV 92.6 90.5  MCH 30.4 30.9  MCHC 32.8 34.2  RDW 14.2 14.2    Chemistries   Recent Labs Lab 05/18/14 0340 05/21/14 0658  NA 135* 134*  K 4.3 4.6  CL 93* 92*  CO2 32 31  GLUCOSE 154* 173*  BUN 20 31*  CREATININE 1.03 0.92  CALCIUM 9.6 9.7   ------------------------------------------------------------------------------------------------------------------ CrCl is unknown because there is no height on file for the current visit. ------------------------------------------------------------------------------------------------------------------ No results found for this  basename: HGBA1C,  in the last 72 hours ------------------------------------------------------------------------------------------------------------------ No results found for this basename: CHOL, HDL, LDLCALC, TRIG, CHOLHDL, LDLDIRECT,  in the last 72 hours ------------------------------------------------------------------------------------------------------------------ No results found for this basename: TSH, T4TOTAL, FREET3, T3FREE, THYROIDAB,  in the last 72 hours ------------------------------------------------------------------------------------------------------------------ No results found for this basename: VITAMINB12, FOLATE, FERRITIN, TIBC, IRON, RETICCTPCT,  in the last 72 hours  Coagulation profile No results found for this basename: INR, PROTIME,  in the last 168 hours  No results found for this basename: DDIMER,  in the last 72 hours  Cardiac Enzymes No results found for this basename: CK, CKMB, TROPONINI, MYOGLOBIN,  in the last 168 hours ------------------------------------------------------------------------------------------------------------------ No components found with this basename: POCBNP,   Micro Results No results found for this or any previous visit (from the past 240 hour(s)).   Chest x-ray on 5/16 shows no acute changes  Assessment & Plan    Respiratory failure, ventilator dependent;  still needing vent  even more at this time. Failing weaning. Increased mucous plugs, status post bronchoscopy failed weaning yesterday after 2 hours COPD on neb treatments Mersa pneumonia off IV vancomycin ,  Seizures on Keppra, controlled C. difficile off Flagyl Hypertension controlled Dementia /agitation continue with Seroquel/Klonopin  History of abdominal aortic aneurysm-chronic to follow as outpatient Anemia Will monitor hemoglobin Depression continue with Zoloft Protein calorie malnutrition on Vital Renal mass follow as outpatient Generalized weakness; PT OT as  tolerated Low TSH probably sick euthyroid syndrome versus subclinical hyperthyroidism because of normal T3 and T4 Mild pulmonary edema; continue with Lasix Tracheitis/ESBL on meropenem  Plan  Continue with corticosteroids and nebulizer treatments C/w IV Abxs CODE STATUS full   DVT Prophylaxis  Lovenox   Carron Curie M.D on 05/22/2014 at 11:47 AM

## 2014-05-23 ENCOUNTER — Other Ambulatory Visit (HOSPITAL_COMMUNITY): Payer: Medicare Other

## 2014-05-23 LAB — BASIC METABOLIC PANEL
BUN: 33 mg/dL — ABNORMAL HIGH (ref 6–23)
CHLORIDE: 93 meq/L — AB (ref 96–112)
CO2: 37 mEq/L — ABNORMAL HIGH (ref 19–32)
Calcium: 9.2 mg/dL (ref 8.4–10.5)
Creatinine, Ser: 0.92 mg/dL (ref 0.50–1.35)
GFR calc non Af Amer: 87 mL/min — ABNORMAL LOW (ref >90)
Glucose, Bld: 201 mg/dL — ABNORMAL HIGH (ref 70–99)
POTASSIUM: 4.5 meq/L (ref 3.7–5.3)
Sodium: 136 mEq/L — ABNORMAL LOW (ref 137–147)

## 2014-05-23 LAB — CBC
HCT: 26.1 % — ABNORMAL LOW (ref 39.0–52.0)
Hemoglobin: 8.6 g/dL — ABNORMAL LOW (ref 13.0–17.0)
MCH: 30.6 pg (ref 26.0–34.0)
MCHC: 33 g/dL (ref 30.0–36.0)
MCV: 92.9 fL (ref 78.0–100.0)
PLATELETS: 282 10*3/uL (ref 150–400)
RBC: 2.81 MIL/uL — ABNORMAL LOW (ref 4.22–5.81)
RDW: 14.1 % (ref 11.5–15.5)
WBC: 16.3 10*3/uL — ABNORMAL HIGH (ref 4.0–10.5)

## 2014-05-23 NOTE — Progress Notes (Addendum)
[facility]                                                                                              Progress note     Patient Demographics  Lucas Glover, is a 62 y.o. male  [id number]:[id number]  FSF:[id number]  DOB - [birth year]  Admit date - 04/25/2014  Admitting Physician Carron Curie, MD  Outpatient Primary MD for the patient is PROVIDER NOT IN SYSTEM  LOS - 28   Chief complaint   Respiratory failure         Subjective:   Lucas Glover has no new complaints  Objective:   Vital signs  Temperature 97. Heart rate 74 Respiratory rate 18 Blood pressure 115/76 Pulse ox 99 %    Exam Awake Alert, confused, No new F.N deficits, [place].AT,PERRAL Supple Neck,No JVD, No cervical lymphadenopathy appreciated.  Tracheostomy midline Symmetrical Chest wall movement, decreased breath sounds bilaterally,  RRR,No Gallops,Rubs or new Murmurs, No Parasternal Heave +ve B.Sounds, Abd Soft, Non tender, No organomegaly appriciated, No rebound - guarding or rigidity. PEG tube noted No Cyanosis, Clubbing or edema, No new Rash or bruise  /left   I&Os unknown  Peg Tube yes Trach #8 Shiley  Data Review   CBC  Recent Labs Lab 05/18/14 0340 05/21/14 0658 05/22/14 1210 05/23/14 0602  WBC 10.1 16.2* 17.3* 16.3*  HGB 10.3* 9.4* 9.6* 8.6*  HCT 31.4* 27.5* 29.2* 26.1*  PLT 261 236 276 282  MCV 92.6 90.5 92.1 92.9  MCH 30.4 30.9 30.3 30.6  MCHC 32.8 34.2 32.9 33.0  RDW 14.2 14.2 14.1 14.1    Chemistries   Recent Labs Lab 05/18/14 0340 05/21/14 0658 05/22/14 1210 05/23/14 0602  NA 135* 134* 138 136*  K 4.3 4.6 5.0 4.5  CL 93* 92* 93* 93*  CO2 32 31 33* 37*  GLUCOSE 154* 173* 116* 201*  BUN 20 31* 33* 33*  CREATININE 1.03 0.92 0.92 0.92  CALCIUM 9.6 9.7 9.7 9.2   ------------------------------------------------------------------------------------------------------------------ CrCl is  unknown because there is no height on file for the current visit. ------------------------------------------------------------------------------------------------------------------ No results found for this basename: HGBA1C,  in the last 72 hours ------------------------------------------------------------------------------------------------------------------ No results found for this basename: CHOL, HDL, LDLCALC, TRIG, CHOLHDL, LDLDIRECT,  in the last 72 hours ------------------------------------------------------------------------------------------------------------------ No results found for this basename: TSH, T4TOTAL, FREET3, T3FREE, THYROIDAB,  in the last 72 hours ------------------------------------------------------------------------------------------------------------------ No results found for this basename: VITAMINB12, FOLATE, FERRITIN, TIBC, IRON, RETICCTPCT,  in the last 72 hours  Coagulation profile No results found for this basename: INR, PROTIME,  in the last 168 hours  No results found for this basename: DDIMER,  in the last 72 hours  Cardiac Enzymes No results found for this basename: CK, CKMB, TROPONINI, MYOGLOBIN,  in the last 168 hours ------------------------------------------------------------------------------------------------------------------ No components found with this basename: POCBNP,   Micro Results No results found for this or any previous visit (from the past 240 hour(s)).   Chest x-ray on 5/16 shows no acute changes  Assessment & Plan    Respiratory failure, ventilator dependent;  still needing vent  even more at this time.  Failing weaning. Increased mucous plugs, status post bronchoscopy failed weaning yesterday after 2 hours COPD on neb treatments Mersa pneumonia off IV vancomycin ,  Seizures on Keppra, controlled C. difficile off Flagyl Hypertension controlled Dementia /agitation continue with Seroquel/Klonopin History of abdominal aortic  aneurysm-chronic to follow as outpatient Anemia Will monitor hemoglobin Depression continue with Zoloft Protein calorie malnutrition on Vital Renal mass follow as outpatient Generalized weakness; PT OT as tolerated Low TSH probably sick euthyroid syndrome versus subclinical hyperthyroidism because of normal T3 and T4 Mild pulmonary edema; continue with Lasix Tracheitis/ESBL on meropenem Leukocytosis, monitor Plan Change Solu-Medrol to prednisone Continue with corticosteroids and nebulizer treatments C/w IV Abxs CODE STATUS full Ventilator management Critical care time 33 minutes  DVT Prophylaxis  Lovenox   Carron CurieAli Diella Gillingham M.D on 05/23/2014 at 11:20 AM

## 2014-05-24 NOTE — Progress Notes (Signed)
[facility]                                                                                              Progress note     Patient Demographics  Lucas Glover, is a 63 y.o. male  [id number]:[id number]  [id number]  DOB - September 09, 1951  Admit date - 04/25/2014  Admitting Physician Carron Curie, MD  Outpatient Primary MD for the patient is PROVIDER NOT IN SYSTEM  LOS - 29   Chief complaint   Respiratory failure         Subjective:   Lucas Glover has no new complaints  Objective:   Vital signs  Temperature 97.1 Heart rate 81 Respiratory rate 17 Blood pressure 122/67 Pulse ox 95 %    Exam Awake Alert, confused, No new F.N deficits, [place].AT,PERRAL Supple Neck,No JVD, No cervical lymphadenopathy appreciated.  Tracheostomy midline Symmetrical Chest wall movement, decreased breath sounds bilaterally,  RRR,No Gallops,Rubs or new Murmurs, No Parasternal Heave +ve B.Sounds, Abd Soft, Non tender, No organomegaly appriciated, No rebound - guarding or rigidity. PEG tube noted No Cyanosis, Clubbing or edema, No new Rash or bruise  /left   I&Os unknown  Peg Tube yes Trach #8 Shiley  Data Review   CBC  Recent Labs Lab 05/18/14 0340 05/21/14 0658 05/22/14 1210 05/23/14 0602  WBC 10.1 16.2* 17.3* 16.3*  HGB 10.3* 9.4* 9.6* 8.6*  HCT 31.4* 27.5* 29.2* 26.1*  PLT 261 236 276 282  MCV 92.6 90.5 92.1 92.9  MCH 30.4 30.9 30.3 30.6  MCHC 32.8 34.2 32.9 33.0  RDW 14.2 14.2 14.1 14.1    Chemistries   Recent Labs Lab 05/18/14 0340 05/21/14 0658 05/22/14 1210 05/23/14 0602  NA 135* 134* 138 136*  K 4.3 4.6 5.0 4.5  CL 93* 92* 93* 93*  CO2 32 31 33* 37*  GLUCOSE 154* 173* 116* 201*  BUN 20 31* 33* 33*  CREATININE 1.03 0.92 0.92 0.92  CALCIUM 9.6 9.7 9.7 9.2   ------------------------------------------------------------------------------------------------------------------ CrCl is  unknown because there is no height on file for the current visit. ------------------------------------------------------------------------------------------------------------------ No results found for this basename: HGBA1C,  in the last 72 hours ------------------------------------------------------------------------------------------------------------------ No results found for this basename: CHOL, HDL, LDLCALC, TRIG, CHOLHDL, LDLDIRECT,  in the last 72 hours ------------------------------------------------------------------------------------------------------------------ No results found for this basename: TSH, T4TOTAL, FREET3, T3FREE, THYROIDAB,  in the last 72 hours ------------------------------------------------------------------------------------------------------------------ No results found for this basename: VITAMINB12, FOLATE, FERRITIN, TIBC, IRON, RETICCTPCT,  in the last 72 hours  Coagulation profile No results found for this basename: INR, PROTIME,  in the last 168 hours  No results found for this basename: DDIMER,  in the last 72 hours  Cardiac Enzymes No results found for this basename: CK, CKMB, TROPONINI, MYOGLOBIN,  in the last 168 hours ------------------------------------------------------------------------------------------------------------------ No components found with this basename: POCBNP,   Micro Results No results found for this or any previous visit (from the past 240 hour(s)).   Chest x-ray on 5/16 shows no acute changes  Assessment & Plan    Respiratory failure, ventilator dependent;  still needing vent  even more at this time.  Failing weaning. Increased mucous plugs, status post bronchoscopy failed weaning yesterday after 2 hours COPD on neb treatments Mersa pneumonia off IV vancomycin ,  Seizures on Keppra, controlled C. difficile off Flagyl Hypertension controlled Dementia /agitation continue with Seroquel/Klonopin History of abdominal aortic  aneurysm-chronic to follow as outpatient Anemia Will monitor hemoglobin Depression continue with Zoloft Protein calorie malnutrition on Vital Renal mass follow as outpatient Generalized weakness; PT OT as tolerated Low TSH probably sick euthyroid syndrome versus subclinical hyperthyroidism because of normal T3 and T4 Mild pulmonary edema; continue with Lasix Tracheitis/ESBL on meropenem Leukocytosis, patient on steroids, monitor Plan Probable discharge in a.m. C/w IV Abxs CODE STATUS full Ventilator management Critical care time 34 minutes  DVT Prophylaxis  Lovenox   Carron CurieAli Dashawn Golda M.D on 05/24/2014 at 1:39 PM

## 2014-05-25 ENCOUNTER — Ambulatory Visit (HOSPITAL_COMMUNITY)
Admission: EM | Admit: 2014-05-25 | Discharge: 2014-05-25 | Disposition: A | Discharge status: Home / Self Care | Payer: Medicare Other | Source: Other Acute Inpatient Hospital | Attending: Internal Medicine | Admitting: Internal Medicine

## 2014-05-25 DIAGNOSIS — R0989 Other specified symptoms and signs involving the circulatory and respiratory systems: Principal | ICD-10-CM | POA: Insufficient documentation

## 2014-05-25 DIAGNOSIS — R0609 Other forms of dyspnea: Secondary | ICD-10-CM | POA: Insufficient documentation

## 2014-12-22 IMAGING — DX DG CHEST 1V PORT
1 series · 1 of 1 positions shown · non-contrast
Comparison: No prior studies are available

CLINICAL DATA: Respiratory distress

PORTABLE CHEST - 1 VIEW

[portable]
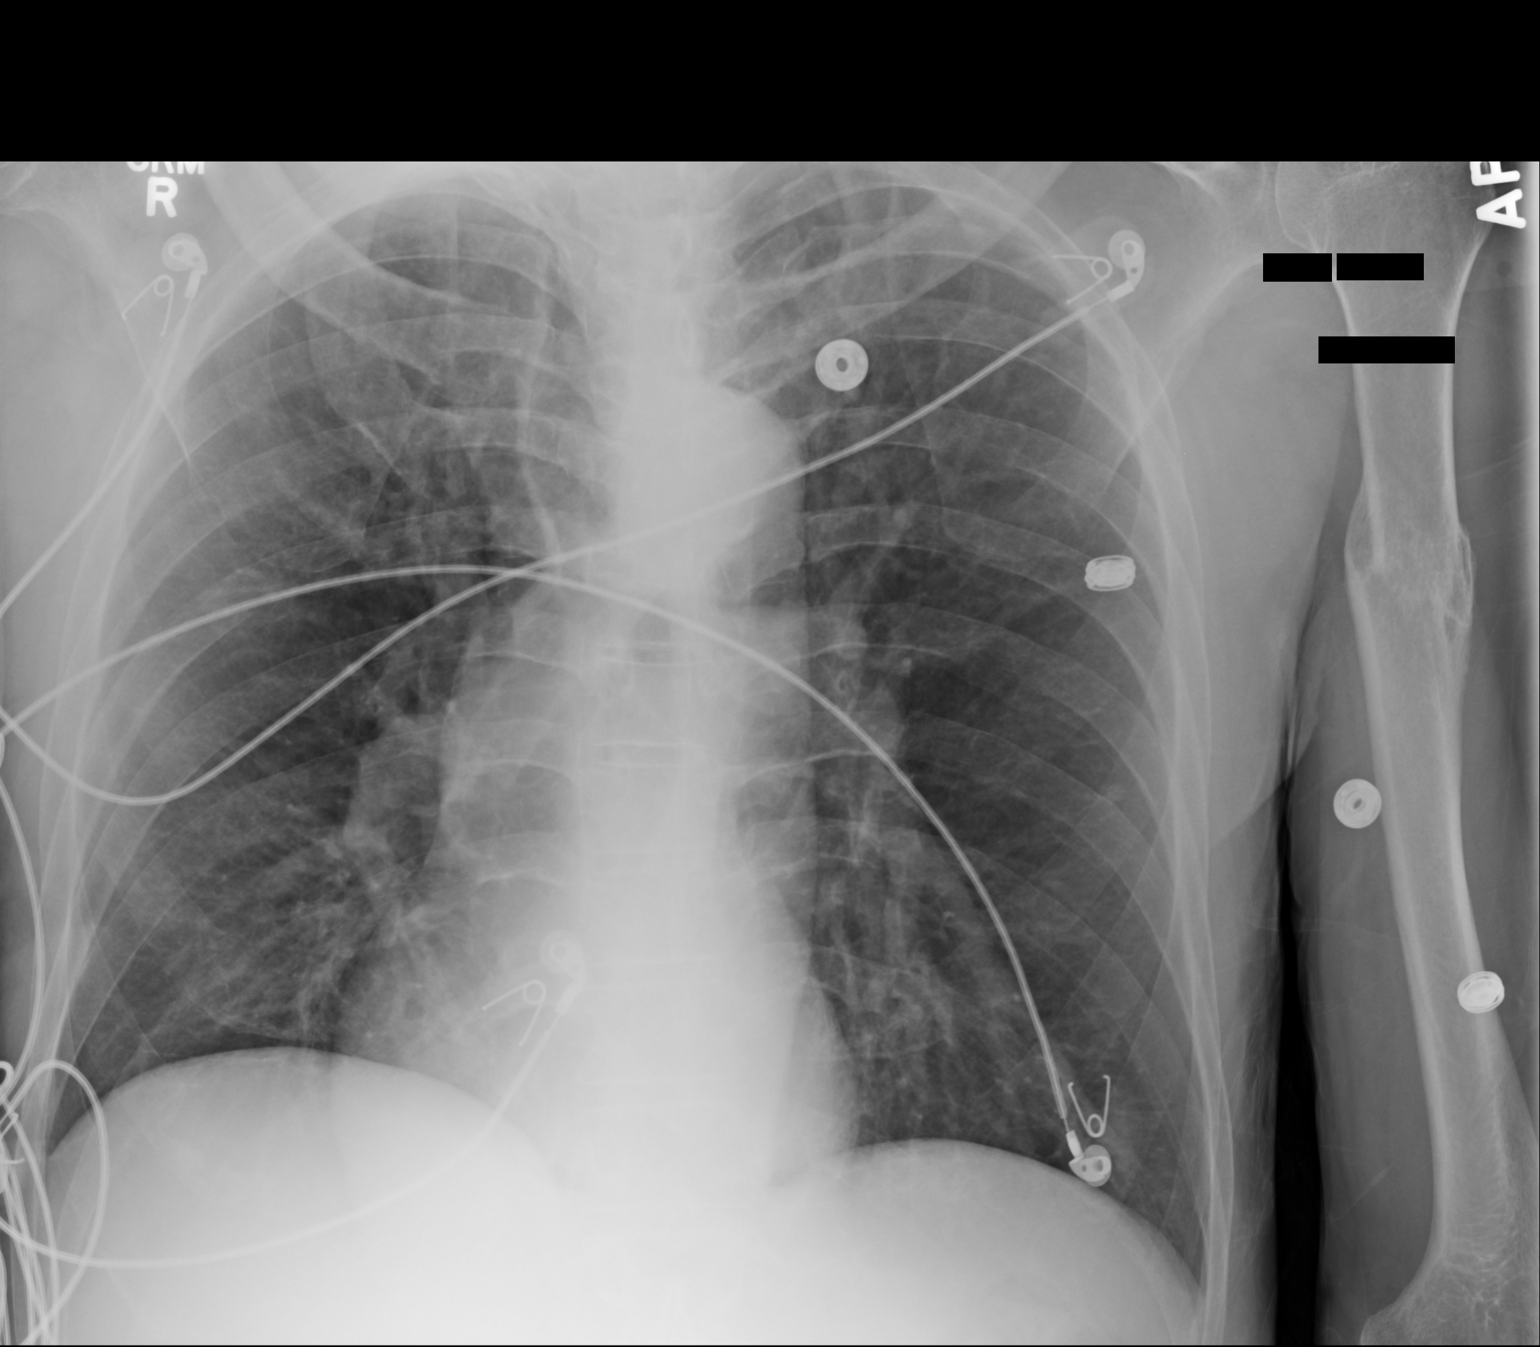

[1 of 1 positions shown; findings below may reference images not displayed]

FINDINGS: The healed left humerus fracture with callus formation
and deformity.  Thorax is rotated to the right.  Lungs are grossly
clear and hyperaerated.  The heart is normal in size.  No
pneumothorax.
IMPRESSION: No active cardiopulmonary disease.

## 2015-09-16 IMAGING — CR DG ABD PORTABLE 1V
1 series · 1 of 1 positions shown · non-contrast
Comparison: SP GASTROSTOMY TUBE INSERT PERCUT W/FL dated 08/11/2013;
DG ABD PORTABLE 1V dated 08/11/2013

CLINICAL DATA: PEG placement.  Gastrografin.

EXAM:
PORTABLE ABDOMEN - 1 VIEW

[AP]
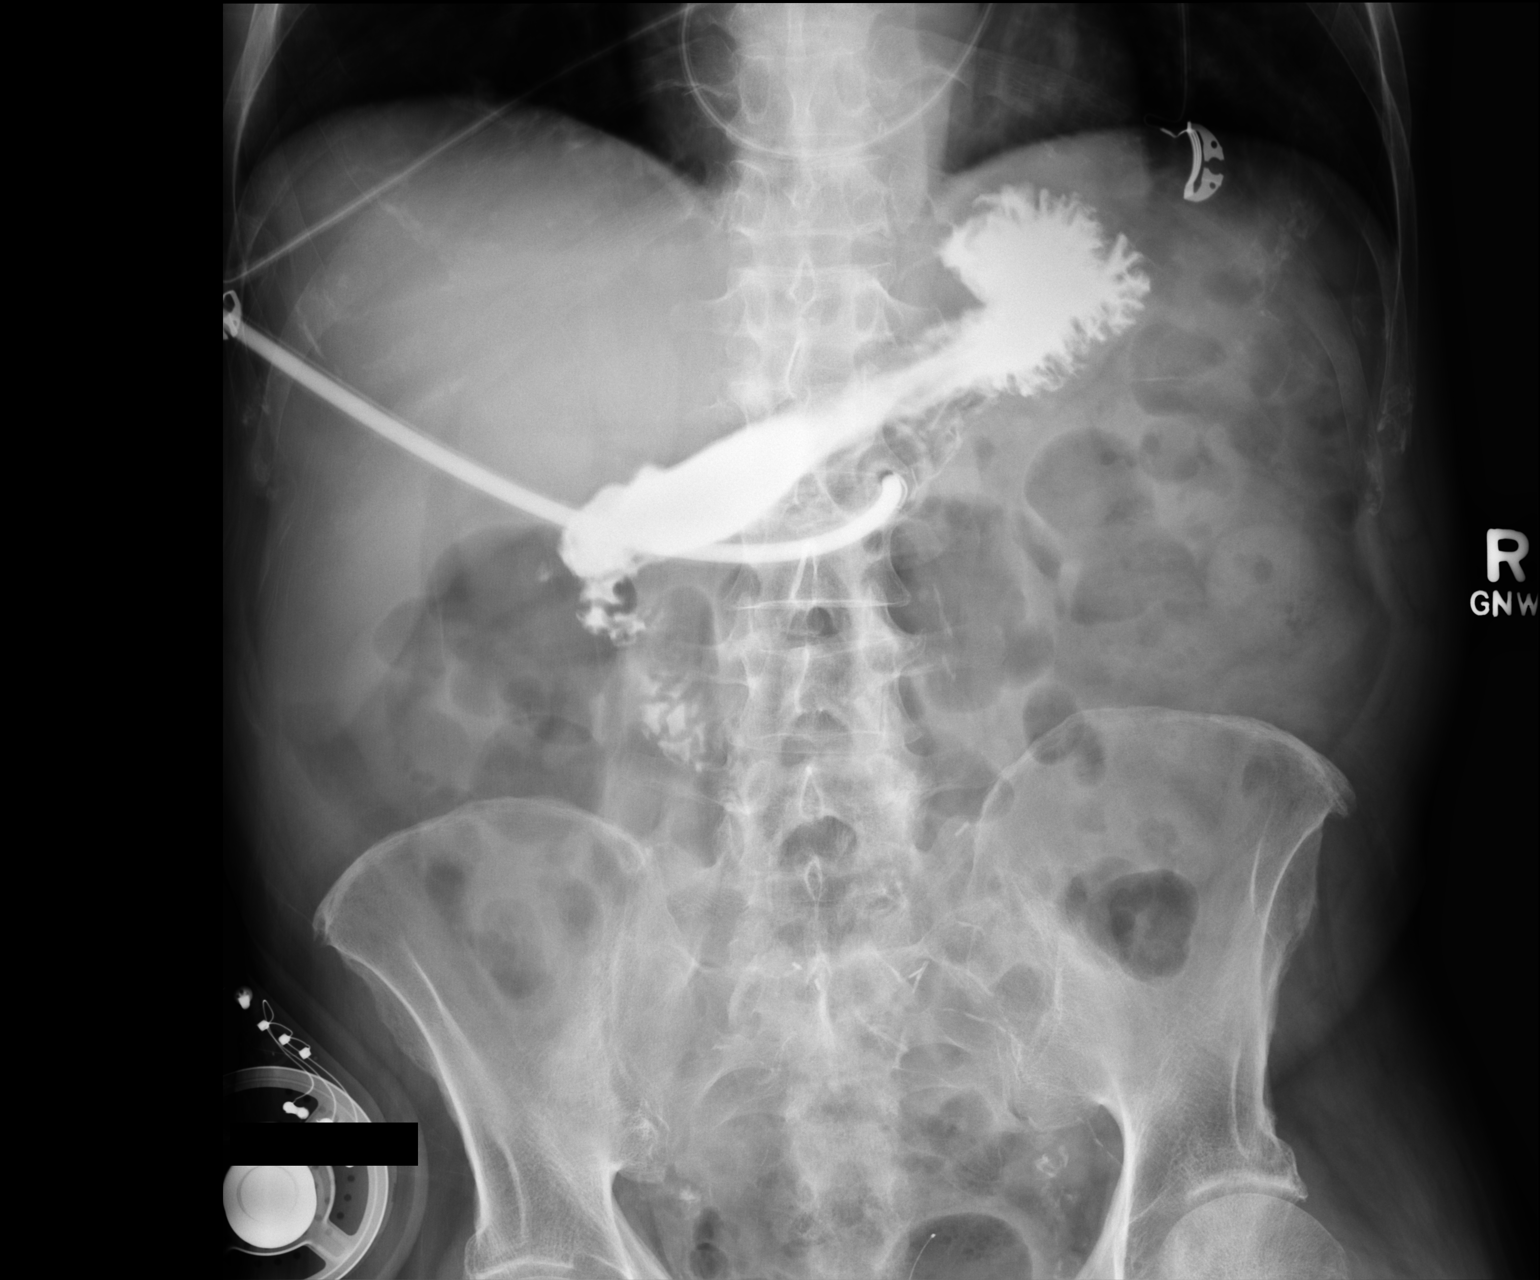

[1 of 1 positions shown; findings below may reference images not displayed]

FINDINGS: Contrast material is demonstrated in the gastrostomy tube and in the
stomach consistent with gastric location of the tube. No
extravasation is demonstrated. Contrast material is noted in the
duodenum suggesting no evidence of gastric outlet obstruction. Bowel
gas pattern is unremarkable.
IMPRESSION: Contrast material in the stomach suggests gastric location of
gastrostomy tube.

## 2015-09-23 IMAGING — CR DG CHEST 1V PORT
1 series · 1 of 1 positions shown · non-contrast
Comparison: Prior chest x-ray 04/26/2014

CLINICAL DATA: Diminished right breath sounds, evaluate for
pneumothorax

EXAM:
PORTABLE CHEST - 1 VIEW

[AP]
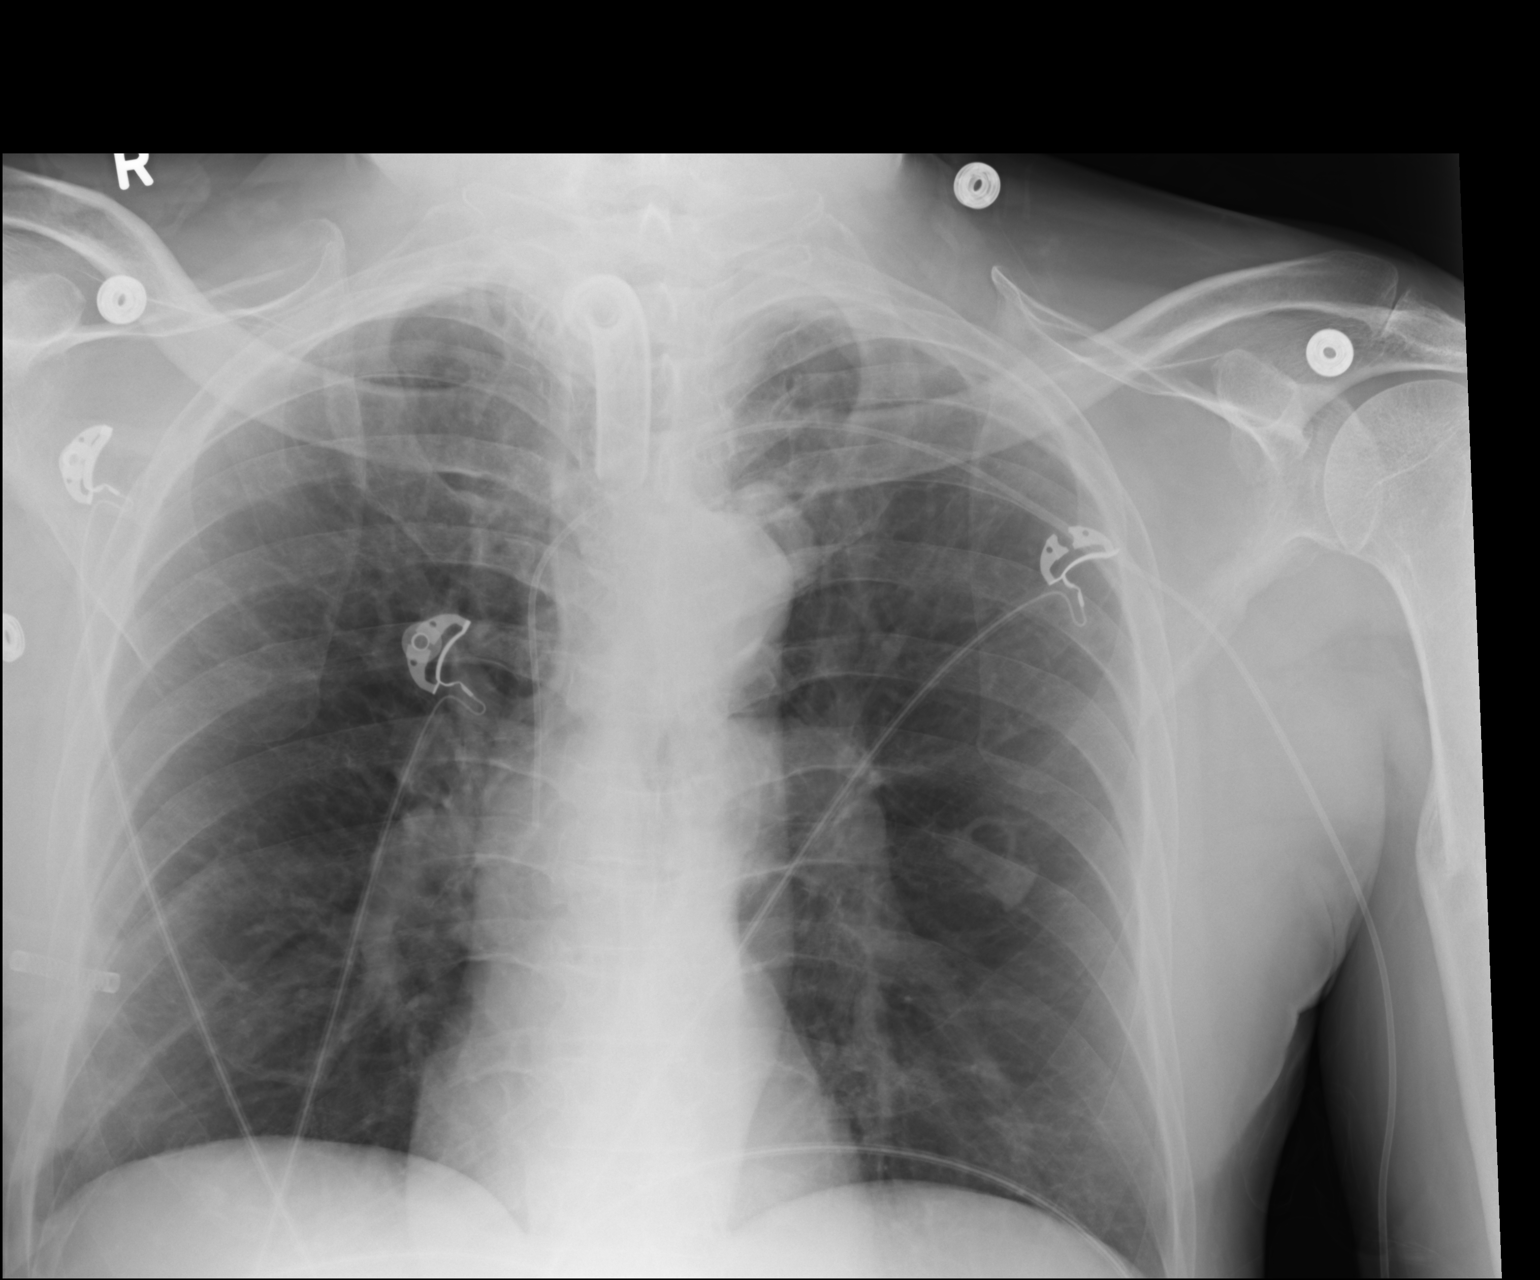

[1 of 1 positions shown; findings below may reference images not displayed]

FINDINGS: Stable position of tracheostomy tube. Tube tip is midline at the
level of the clavicles. Left upper extremity PICC remains in good
position with the tip in the distal SVC. Stable cardiac and
mediastinal contours. Atherosclerotic calcification present in the
transverse aorta. No evidence of pneumothorax. No new airspace
consolidation or pleural effusion. Stable background chronic
bronchitic changes and interstitial prominence consistent with
underlying COPD. No acute osseous abnormality.
IMPRESSION: Stable chest x-ray without evidence of acute cardiopulmonary
disease. Specifically, negative for pneumothorax.

Stable and satisfactory support apparatus.

## 2015-09-28 IMAGING — DX DG CHEST 1V PORT
1 series · 1 of 1 positions shown · non-contrast
Comparison: DG CHEST 1V PORT dated 05/05/2014;

CLINICAL DATA: Respiratory failure

EXAM:
PORTABLE CHEST - 1 VIEW

[portable]
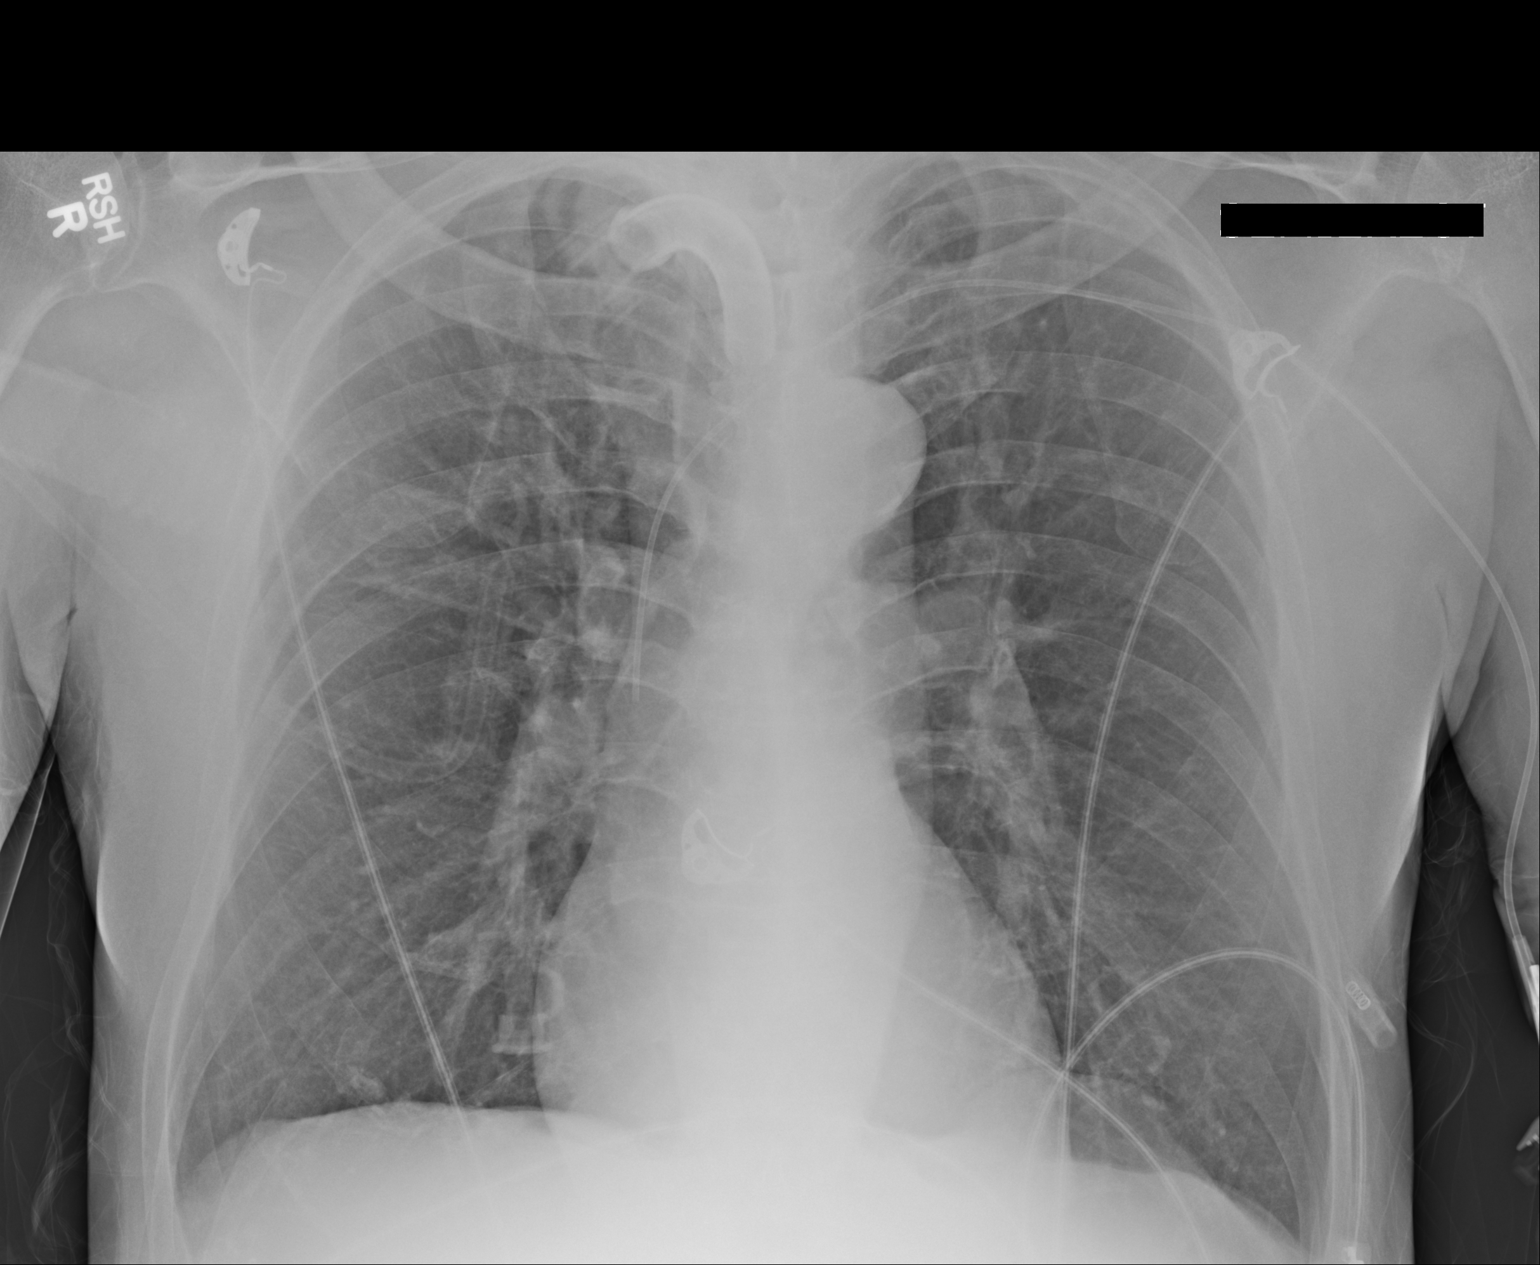

[1 of 1 positions shown; findings below may reference images not displayed]

DG CHEST 1V PORT
dated 05/02/2014; DG CHEST 1V PORT dated 04/26/2014; DG CHEST 1V PORT
dated 08/18/2013
FINDINGS: Grossly unchanged cardiac silhouette and mediastinal contours.
Stable positioning of support apparatus. No pneumothorax. Mild
pulmonary venous congestion without frank evidence of edema. Grossly
unchanged perihilar opacities favored to represent atelectasis. No
new focal airspace opacities. No pleural effusion. Unchanged bones.
IMPRESSION: 1.  Stable positioning of support apparatus.  No pneumothorax.
2. Mild worsening of pulmonary venous congestion without frank
evidence of edema.
3. Grossly unchanged perihilar opacities favored to represent
atelectasis. No new focal airspace opacities.

## 2015-09-30 IMAGING — CR DG CHEST 1V PORT
1 series · 1 of 1 positions shown · non-contrast
Comparison: May 07, 2014

CLINICAL DATA: Respiratory failure

EXAM:
PORTABLE CHEST - 1 VIEW

[AP]
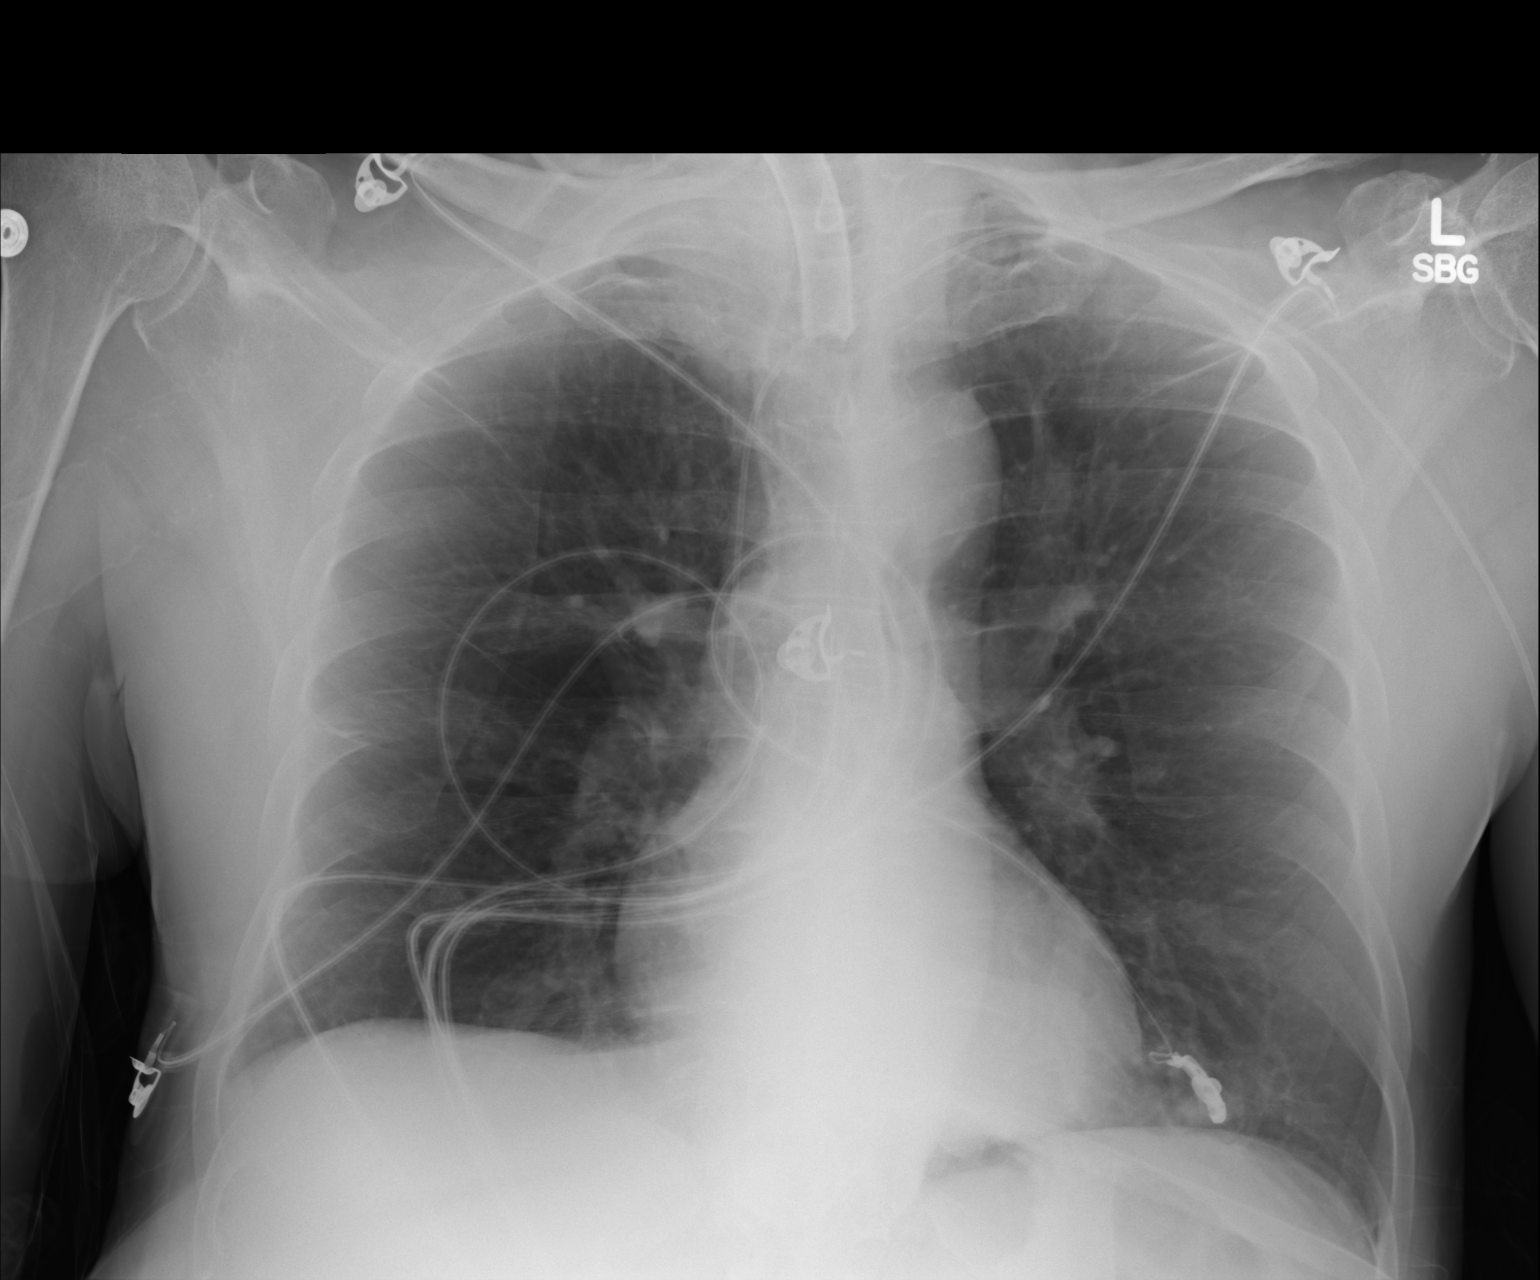

[1 of 1 positions shown; findings below may reference images not displayed]

FINDINGS: Tracheostomy tube tip is 6.6 cm above the carinal. Central catheter
tip is in the superior vena cava. No pneumothorax.

There is underlying emphysematous change. There is no edema or
consolidation. The heart size and pulmonary vascularity are normal.
No adenopathy.
IMPRESSION: Tube and catheter positions as described without appreciable
pneumothorax. No edema or consolidation.

## 2017-03-25 DEATH — deceased

## 2019-12-29 MED ORDER — AMLODIPINE 10 MG TABLET
10 | ORAL_TABLET | ORAL | 2 refills | 90.00000 days | Status: AC
Start: 2019-12-29 — End: 2020-06-17

## 2020-01-01 MED ORDER — CILOSTAZOL ORAL
ORAL | 3.00 refills | 30.00000 days | Status: AC
Start: 2020-01-01 — End: 2020-05-21

## 2020-01-13 MED ORDER — METOPROLOL TARTRATE IMMEDIATE RELEASE 25 MG TABLET
25 | ORAL_TABLET | ORAL | 2 refills | 90.00000 days | Status: AC
Start: 2020-01-13 — End: 2020-12-23

## 2020-01-13 MED ORDER — ELIQUIS 5 MG TABLET
5 | ORAL_TABLET | ORAL | 2 refills | 30.00000 days | Status: AC
Start: 2020-01-13 — End: 2020-12-23

## 2020-02-19 MED ORDER — SODIUM BICARBONATE ORAL
Freq: Every day | ORAL | 1.00 refills | 22.50000 days | Status: AC
Start: 2020-02-19 — End: ?

## 2020-02-25 MED ORDER — ATORVASTATIN 20 MG TABLET
20 | ORAL_TABLET | ORAL | 1 refills | 90.00000 days | Status: AC
Start: 2020-02-25 — End: 2020-03-12

## 2020-02-25 MED ORDER — VALSARTAN 320 MG TABLET
320 | ORAL_TABLET | ORAL | 2 refills | 90.00000 days | Status: AC
Start: 2020-02-25 — End: 2020-05-25

## 2020-03-12 MED ORDER — ATORVASTATIN 20 MG TABLET
20 | ORAL_TABLET | ORAL | 1 refills | 90.00000 days | Status: AC
Start: 2020-03-12 — End: 2020-04-09

## 2020-04-09 MED ORDER — ATORVASTATIN 20 MG TABLET
20 | ORAL_TABLET | Freq: Every day | ORAL | 1 refills | 90.00000 days | Status: AC
Start: 2020-04-09 — End: 2020-04-16

## 2020-04-16 MED ORDER — ATORVASTATIN 20 MG TABLET
20 | ORAL_TABLET | ORAL | 1 refills | 90.00000 days | Status: AC
Start: 2020-04-16 — End: 2020-04-26

## 2020-04-26 MED ORDER — ATORVASTATIN 20 MG TABLET
20 | ORAL_TABLET | Freq: Every day | ORAL | 2 refills | 90.00000 days | Status: AC
Start: 2020-04-26 — End: 2020-11-02

## 2020-05-25 MED ORDER — VALSARTAN 320 MG TABLET
320 | ORAL_TABLET | ORAL | 2 refills | 90.00000 days | Status: AC
Start: 2020-05-25 — End: 2020-06-02

## 2020-05-25 MED ORDER — FOLIC ACID 1 MG TABLET
1 | ORAL_TABLET | ORAL | 2 refills | 90.00000 days | Status: AC
Start: 2020-05-25 — End: 2020-11-16

## 2020-06-02 MED ORDER — VALSARTAN 320 MG TABLET
320 | ORAL_TABLET | ORAL | 2 refills | 90.00000 days | Status: AC
Start: 2020-06-02 — End: 2020-09-06

## 2020-06-17 MED ORDER — AMLODIPINE 10 MG TABLET
10 | ORAL_TABLET | ORAL | 2 refills | 90.00000 days | Status: AC
Start: 2020-06-17 — End: 2020-11-08

## 2020-06-22 ENCOUNTER — Inpatient Hospital Stay: Admit: 2020-06-22 | Discharge: 2020-06-22 | Payer: BLUE CROSS/BLUE SHIELD | Primary: Geriatric Medicine

## 2020-06-22 DIAGNOSIS — I4819 Other persistent atrial fibrillation: Secondary | ICD-10-CM

## 2020-07-01 NOTE — Other
Called and spoke to his wife - should call the office back later this afternoon when he wakes up.

## 2020-09-06 MED ORDER — VALSARTAN 320 MG TABLET
320 | ORAL_TABLET | ORAL | 2 refills | 90.00000 days | Status: AC
Start: 2020-09-06 — End: 2020-11-16

## 2020-09-15 ENCOUNTER — Inpatient Hospital Stay: Admit: 2020-09-15 | Discharge: 2020-09-15 | Payer: BLUE CROSS/BLUE SHIELD | Primary: Geriatric Medicine

## 2020-09-15 DIAGNOSIS — I350 Nonrheumatic aortic (valve) stenosis: Secondary | ICD-10-CM

## 2020-09-15 DIAGNOSIS — I421 Obstructive hypertrophic cardiomyopathy: Secondary | ICD-10-CM

## 2020-11-02 MED ORDER — ATORVASTATIN 20 MG TABLET
20 | ORAL_TABLET | ORAL | 4 refills | 90.00000 days | Status: AC
Start: 2020-11-02 — End: 2020-12-07

## 2020-11-08 MED ORDER — AMLODIPINE 10 MG TABLET
10 | ORAL_TABLET | ORAL | 2 refills | 90.00000 days | Status: AC
Start: 2020-11-08 — End: 2021-02-04

## 2020-11-16 MED ORDER — FOLIC ACID 1 MG TABLET
1 | ORAL_TABLET | ORAL | 2 refills | 90.00000 days | Status: AC
Start: 2020-11-16 — End: 2021-05-09

## 2020-11-16 MED ORDER — VALSARTAN 320 MG TABLET
320 | ORAL_TABLET | ORAL | 2 refills | 90.00000 days | Status: AC
Start: 2020-11-16 — End: 2021-02-17

## 2020-12-07 MED ORDER — ATORVASTATIN 20 MG TABLET
20 | ORAL_TABLET | ORAL | 2 refills | 90.00000 days | Status: AC
Start: 2020-12-07 — End: 2021-03-04

## 2020-12-23 MED ORDER — APIXABAN 5 MG TABLET
5 | ORAL_TABLET | Freq: Two times a day (BID) | ORAL | 2 refills | 30.00000 days | Status: AC
Start: 2020-12-23 — End: 2021-03-22

## 2020-12-23 MED ORDER — PRESERVISION AREDS ORAL
ORAL | 0.00 refills | 1.00000 days | Status: AC
Start: 2020-12-23 — End: ?

## 2020-12-23 MED ORDER — METOPROLOL TARTRATE IMMEDIATE RELEASE 25 MG TABLET
25 | ORAL_TABLET | Freq: Two times a day (BID) | ORAL | 2 refills | 90.00000 days | Status: AC
Start: 2020-12-23 — End: 2021-03-22

## 2021-02-04 MED ORDER — AMLODIPINE 10 MG TABLET
10 | ORAL_TABLET | ORAL | 2 refills | 90.00000 days | Status: AC
Start: 2021-02-04 — End: 2021-08-04

## 2021-02-17 MED ORDER — VALSARTAN 320 MG TABLET
320 | ORAL_TABLET | ORAL | 2 refills | 90.00000 days | Status: AC
Start: 2021-02-17 — End: 2021-08-04

## 2021-03-04 MED ORDER — ATORVASTATIN 20 MG TABLET
20 | ORAL_TABLET | Freq: Every day | ORAL | 4 refills | 90.00000 days | Status: AC
Start: 2021-03-04 — End: 2021-03-10

## 2021-03-10 MED ORDER — ATORVASTATIN 20 MG TABLET
20 | ORAL_TABLET | Freq: Every day | ORAL | 3 refills | 90.00000 days | Status: AC
Start: 2021-03-10 — End: 2022-03-10

## 2021-03-22 MED ORDER — METOPROLOL TARTRATE IMMEDIATE RELEASE 25 MG TABLET
25 | ORAL_TABLET | ORAL | 2 refills | 90.00000 days | Status: AC
Start: 2021-03-22 — End: 2022-08-31

## 2021-03-22 MED ORDER — ELIQUIS 5 MG TABLET
5 | ORAL_TABLET | ORAL | 2 refills | 30.00000 days | Status: AC
Start: 2021-03-22 — End: 2021-04-23

## 2021-04-21 MED ORDER — VITAMIN B-12 ORAL
ORAL | 0.00 refills | 84.00000 days | Status: AC
Start: 2021-04-21 — End: ?

## 2021-04-21 MED ORDER — IRON ORAL
ORAL | 0.00 refills | 1.00000 days | Status: AC
Start: 2021-04-21 — End: ?

## 2021-04-21 MED ORDER — CLOPIDOGREL 75 MG TABLET
75 | Freq: Every day | ORAL | 4.00 refills | 90.00000 days | Status: AC
Start: 2021-04-21 — End: 2021-04-29

## 2021-04-29 MED ORDER — CLOPIDOGREL 75 MG TABLET
75 | ORAL_TABLET | ORAL | 2 refills | 90.00000 days | Status: AC
Start: 2021-04-29 — End: 2021-07-26

## 2021-05-09 MED ORDER — FOLIC ACID 1 MG TABLET
1 | ORAL_TABLET | ORAL | 2 refills | 90.00000 days | Status: AC
Start: 2021-05-09 — End: 2022-03-10

## 2021-06-13 MED ORDER — FUROSEMIDE 20 MG TABLET
20 | ORAL_TABLET | Freq: Every day | ORAL | 2 refills | 70.00000 days | Status: AC
Start: 2021-06-13 — End: 2022-06-23

## 2021-07-26 MED ORDER — CLOPIDOGREL 75 MG TABLET
75 | ORAL_TABLET | ORAL | 2 refills | 90.00000 days | Status: AC
Start: 2021-07-26 — End: 2021-09-08

## 2021-08-04 MED ORDER — AMLODIPINE 10 MG TABLET
10 | ORAL_TABLET | ORAL | 2 refills | 90.00000 days | Status: AC
Start: 2021-08-04 — End: 2021-08-11

## 2021-08-04 MED ORDER — VALSARTAN 320 MG TABLET
320 | ORAL_TABLET | ORAL | 2 refills | 90.00000 days | Status: AC
Start: 2021-08-04 — End: 2022-02-15

## 2021-08-11 MED ORDER — AMLODIPINE 10 MG TABLET
10 | ORAL_TABLET | ORAL | 2 refills | 90.00000 days | Status: AC
Start: 2021-08-11 — End: 2021-11-14

## 2021-11-14 MED ORDER — AMLODIPINE 10 MG TABLET
10 | ORAL_TABLET | ORAL | 2 refills | 90.00000 days | Status: AC
Start: 2021-11-14 — End: 2022-01-27

## 2022-01-27 ENCOUNTER — Encounter: Admit: 2022-01-27 | Payer: PRIVATE HEALTH INSURANCE | Attending: Cardiovascular Disease | Primary: Geriatric Medicine

## 2022-01-27 MED ORDER — AMLODIPINE 10 MG TABLET
10 | ORAL_TABLET | ORAL | 2 refills | 90.00000 days | Status: AC
Start: 2022-01-27 — End: 2022-05-09

## 2022-02-10 ENCOUNTER — Inpatient Hospital Stay: Admit: 2022-02-10 | Discharge: 2022-02-10 | Payer: PRIVATE HEALTH INSURANCE | Primary: Geriatric Medicine

## 2022-02-10 DIAGNOSIS — I350 Nonrheumatic aortic (valve) stenosis: Secondary | ICD-10-CM

## 2022-02-15 ENCOUNTER — Encounter: Admit: 2022-02-15 | Payer: PRIVATE HEALTH INSURANCE | Attending: Cardiovascular Disease | Primary: Geriatric Medicine

## 2022-02-15 MED ORDER — VALSARTAN 320 MG TABLET
320 | ORAL_TABLET | ORAL | 2 refills | 90.00000 days | Status: AC
Start: 2022-02-15 — End: 2022-10-26

## 2022-02-17 ENCOUNTER — Telehealth: Admit: 2022-02-17 | Payer: PRIVATE HEALTH INSURANCE | Attending: Cardiovascular Disease | Primary: Geriatric Medicine

## 2022-02-28 ENCOUNTER — Telehealth: Admit: 2022-02-28 | Payer: PRIVATE HEALTH INSURANCE | Attending: Cardiovascular Disease | Primary: Geriatric Medicine

## 2022-02-28 ENCOUNTER — Encounter: Admit: 2022-02-28 | Payer: PRIVATE HEALTH INSURANCE | Attending: Cardiovascular Disease | Primary: Geriatric Medicine

## 2022-02-28 ENCOUNTER — Encounter: Admit: 2022-02-28 | Payer: Medicare (Managed Care) | Attending: Cardiovascular Disease | Primary: Geriatric Medicine

## 2022-03-01 ENCOUNTER — Encounter: Admit: 2022-03-01 | Payer: PRIVATE HEALTH INSURANCE | Primary: Geriatric Medicine

## 2022-03-09 ENCOUNTER — Encounter: Admit: 2022-03-09 | Payer: PRIVATE HEALTH INSURANCE | Attending: Cardiovascular Disease | Primary: Geriatric Medicine

## 2022-03-09 ENCOUNTER — Encounter: Admit: 2022-03-09 | Payer: Medicare (Managed Care) | Attending: Cardiovascular Disease | Primary: Geriatric Medicine

## 2022-03-10 MED ORDER — ATORVASTATIN 20 MG TABLET
20 | ORAL_TABLET | ORAL | 5 refills | 90.00000 days | Status: AC
Start: 2022-03-10 — End: 2022-11-17

## 2022-03-10 MED ORDER — FOLIC ACID 1 MG TABLET
1 | ORAL_TABLET | ORAL | 2 refills | 90.00000 days | Status: AC
Start: 2022-03-10 — End: 2022-06-15

## 2022-03-23 ENCOUNTER — Encounter: Admit: 2022-03-23 | Payer: Medicare (Managed Care) | Attending: Cardiovascular Disease | Primary: Geriatric Medicine

## 2022-04-12 ENCOUNTER — Encounter: Admit: 2022-04-12 | Payer: PRIVATE HEALTH INSURANCE | Attending: Cardiovascular Disease | Primary: Geriatric Medicine

## 2022-04-12 ENCOUNTER — Encounter: Admit: 2022-04-12 | Payer: Medicare (Managed Care) | Attending: Cardiovascular Disease | Primary: Geriatric Medicine

## 2022-04-12 MED ORDER — PRESERVISION LUTEIN ORAL
ORAL | Status: AC
Start: 2022-04-12 — End: 2022-06-23

## 2022-05-09 ENCOUNTER — Encounter: Admit: 2022-05-09 | Payer: PRIVATE HEALTH INSURANCE | Attending: Cardiovascular Disease | Primary: Geriatric Medicine

## 2022-05-09 MED ORDER — AMLODIPINE 10 MG TABLET
10 | ORAL_TABLET | ORAL | 2 refills | 90.00000 days | Status: AC
Start: 2022-05-09 — End: 2022-06-16

## 2022-05-31 ENCOUNTER — Encounter: Admit: 2022-05-31 | Payer: PRIVATE HEALTH INSURANCE | Primary: Geriatric Medicine

## 2022-06-01 ENCOUNTER — Encounter: Admit: 2022-06-01 | Payer: PRIVATE HEALTH INSURANCE | Primary: Geriatric Medicine

## 2022-06-02 ENCOUNTER — Encounter: Admit: 2022-06-02 | Payer: PRIVATE HEALTH INSURANCE | Attending: Cardiovascular Disease | Primary: Geriatric Medicine

## 2022-06-13 ENCOUNTER — Telehealth: Admit: 2022-06-13 | Payer: PRIVATE HEALTH INSURANCE | Attending: Cardiovascular Disease | Primary: Geriatric Medicine

## 2022-06-15 ENCOUNTER — Encounter: Admit: 2022-06-15 | Payer: PRIVATE HEALTH INSURANCE | Attending: Cardiovascular Disease | Primary: Geriatric Medicine

## 2022-06-15 MED ORDER — FOLIC ACID 1 MG TABLET
1 | ORAL_TABLET | ORAL | 2 refills | 90.00000 days | Status: AC
Start: 2022-06-15 — End: 2022-09-15

## 2022-06-16 ENCOUNTER — Encounter: Admit: 2022-06-16 | Payer: Medicare (Managed Care) | Attending: Cardiovascular Disease | Primary: Geriatric Medicine

## 2022-06-16 ENCOUNTER — Encounter: Admit: 2022-06-16 | Payer: PRIVATE HEALTH INSURANCE | Attending: Cardiovascular Disease | Primary: Geriatric Medicine

## 2022-06-16 MED ORDER — DILTIAZEM CD 240 MG CAPSULE,EXTENDED RELEASE 24 HR
240 | ORAL_CAPSULE | Freq: Every day | ORAL | 4 refills | 90.00000 days | Status: AC
Start: 2022-06-16 — End: 2022-11-02

## 2022-06-19 ENCOUNTER — Encounter: Admit: 2022-06-19 | Payer: PRIVATE HEALTH INSURANCE | Attending: Cardiovascular Disease | Primary: Geriatric Medicine

## 2022-06-22 ENCOUNTER — Encounter: Admit: 2022-06-22 | Payer: PRIVATE HEALTH INSURANCE | Attending: Cardiovascular Disease | Primary: Geriatric Medicine

## 2022-06-22 ENCOUNTER — Inpatient Hospital Stay: Admit: 2022-06-22 | Discharge: 2022-06-22 | Payer: PRIVATE HEALTH INSURANCE | Primary: Geriatric Medicine

## 2022-06-22 ENCOUNTER — Encounter: Admit: 2022-06-22 | Payer: PRIVATE HEALTH INSURANCE | Primary: Geriatric Medicine

## 2022-06-22 ENCOUNTER — Telehealth: Admit: 2022-06-22 | Payer: PRIVATE HEALTH INSURANCE | Attending: Cardiovascular Disease | Primary: Geriatric Medicine

## 2022-06-22 DIAGNOSIS — I5032 Chronic diastolic (congestive) heart failure: Secondary | ICD-10-CM

## 2022-06-22 DIAGNOSIS — I421 Obstructive hypertrophic cardiomyopathy: Secondary | ICD-10-CM

## 2022-06-22 DIAGNOSIS — I480 Paroxysmal atrial fibrillation: Secondary | ICD-10-CM

## 2022-06-22 MED ORDER — POTASSIUM CHLORIDE ER 20 MEQ TABLET,EXTENDED RELEASE
20 | Freq: Every day | ORAL | 2.00 refills | 30.00000 days | Status: AC
Start: 2022-06-22 — End: 2022-06-22

## 2022-06-22 MED ORDER — POTASSIUM CHLORIDE ER 20 MEQ TABLET,EXTENDED RELEASE
20 | ORAL_TABLET | Freq: Every day | ORAL | 2 refills | 30.00000 days | Status: AC
Start: 2022-06-22 — End: 2022-08-31

## 2022-06-23 ENCOUNTER — Encounter: Admit: 2022-06-23 | Payer: Medicare (Managed Care) | Attending: Cardiovascular Disease | Primary: Geriatric Medicine

## 2022-06-23 ENCOUNTER — Encounter: Admit: 2022-06-23 | Payer: PRIVATE HEALTH INSURANCE | Attending: Cardiovascular Disease | Primary: Geriatric Medicine

## 2022-06-23 MED ORDER — SPIRONOLACTONE 25 MG TABLET
25 | ORAL_TABLET | Freq: Every day | ORAL | 4 refills | 90.00000 days | Status: AC
Start: 2022-06-23 — End: 2022-08-31

## 2022-06-23 MED ORDER — TORSEMIDE 20 MG TABLET
20 | ORAL_TABLET | Freq: Two times a day (BID) | ORAL | 4 refills | 45.00000 days | Status: AC
Start: 2022-06-23 — End: 2023-01-23

## 2022-06-29 ENCOUNTER — Ambulatory Visit: Admit: 2022-06-29 | Payer: PRIVATE HEALTH INSURANCE | Primary: Geriatric Medicine

## 2022-06-30 ENCOUNTER — Encounter: Admit: 2022-06-30 | Payer: Medicare (Managed Care) | Attending: Cardiovascular Disease | Primary: Geriatric Medicine

## 2022-06-30 ENCOUNTER — Encounter: Admit: 2022-06-30 | Payer: PRIVATE HEALTH INSURANCE | Attending: Cardiovascular Disease | Primary: Geriatric Medicine

## 2022-07-10 ENCOUNTER — Ambulatory Visit: Admit: 2022-07-10 | Payer: PRIVATE HEALTH INSURANCE | Primary: Geriatric Medicine

## 2022-07-14 ENCOUNTER — Encounter: Admit: 2022-07-14 | Payer: PRIVATE HEALTH INSURANCE | Attending: Cardiovascular Disease | Primary: Geriatric Medicine

## 2022-07-14 ENCOUNTER — Telehealth: Admit: 2022-07-14 | Payer: PRIVATE HEALTH INSURANCE | Attending: Cardiovascular Disease | Primary: Geriatric Medicine

## 2022-07-17 ENCOUNTER — Encounter: Admit: 2022-07-17 | Payer: Medicare (Managed Care) | Attending: Cardiovascular Disease | Primary: Geriatric Medicine

## 2022-07-20 ENCOUNTER — Encounter: Admit: 2022-07-20 | Payer: PRIVATE HEALTH INSURANCE | Attending: Cardiovascular Disease | Primary: Geriatric Medicine

## 2022-07-20 ENCOUNTER — Encounter: Admit: 2022-07-20 | Payer: Medicare (Managed Care) | Attending: Cardiovascular Disease | Primary: Geriatric Medicine

## 2022-07-24 ENCOUNTER — Telehealth: Admit: 2022-07-24 | Payer: PRIVATE HEALTH INSURANCE | Attending: Cardiovascular Disease | Primary: Geriatric Medicine

## 2022-07-24 ENCOUNTER — Encounter: Admit: 2022-07-24 | Payer: PRIVATE HEALTH INSURANCE | Attending: Cardiovascular Disease | Primary: Geriatric Medicine

## 2022-08-04 ENCOUNTER — Encounter: Admit: 2022-08-04 | Payer: PRIVATE HEALTH INSURANCE | Attending: Cardiovascular Disease | Primary: Geriatric Medicine

## 2022-08-04 ENCOUNTER — Telehealth: Admit: 2022-08-04 | Payer: PRIVATE HEALTH INSURANCE | Attending: Cardiovascular Disease | Primary: Geriatric Medicine

## 2022-08-21 ENCOUNTER — Telehealth: Admit: 2022-08-21 | Payer: PRIVATE HEALTH INSURANCE | Attending: Cardiovascular Disease | Primary: Geriatric Medicine

## 2022-08-21 ENCOUNTER — Encounter: Admit: 2022-08-21 | Payer: PRIVATE HEALTH INSURANCE | Attending: Cardiovascular Disease | Primary: Geriatric Medicine

## 2022-08-30 ENCOUNTER — Encounter: Admit: 2022-08-30 | Payer: PRIVATE HEALTH INSURANCE | Attending: Cardiovascular Disease | Primary: Geriatric Medicine

## 2022-08-31 ENCOUNTER — Encounter: Admit: 2022-08-31 | Payer: Medicare (Managed Care) | Attending: Cardiovascular Disease | Primary: Geriatric Medicine

## 2022-08-31 ENCOUNTER — Encounter: Admit: 2022-08-31 | Payer: PRIVATE HEALTH INSURANCE | Attending: Cardiovascular Disease | Primary: Geriatric Medicine

## 2022-08-31 ENCOUNTER — Telehealth: Admit: 2022-08-31 | Payer: PRIVATE HEALTH INSURANCE | Primary: Geriatric Medicine

## 2022-08-31 MED ORDER — METOPROLOL TARTRATE IMMEDIATE RELEASE 25 MG TABLET
25 | ORAL_TABLET | Freq: Two times a day (BID) | ORAL | 4 refills | 90.00000 days | Status: AC
Start: 2022-08-31 — End: 2022-10-26

## 2022-09-15 ENCOUNTER — Encounter: Admit: 2022-09-15 | Payer: PRIVATE HEALTH INSURANCE | Attending: Cardiovascular Disease | Primary: Geriatric Medicine

## 2022-09-15 MED ORDER — FOLIC ACID 1 MG TABLET
1 | ORAL_TABLET | ORAL | 2 refills | 90.00000 days | Status: AC
Start: 2022-09-15 — End: 2023-01-23

## 2022-09-18 ENCOUNTER — Telehealth: Admit: 2022-09-18 | Payer: PRIVATE HEALTH INSURANCE | Primary: Geriatric Medicine

## 2022-10-12 ENCOUNTER — Encounter: Admit: 2022-10-12 | Payer: PRIVATE HEALTH INSURANCE | Primary: Geriatric Medicine

## 2022-10-13 ENCOUNTER — Encounter: Admit: 2022-10-13 | Payer: PRIVATE HEALTH INSURANCE | Primary: Geriatric Medicine

## 2022-10-14 ENCOUNTER — Encounter
Admit: 2022-10-14 | Payer: PRIVATE HEALTH INSURANCE | Attending: Vascular and Interventional Radiology | Primary: Geriatric Medicine

## 2022-10-14 ENCOUNTER — Encounter: Admit: 2022-10-14 | Payer: PRIVATE HEALTH INSURANCE | Primary: Geriatric Medicine

## 2022-10-16 ENCOUNTER — Encounter: Admit: 2022-10-16 | Payer: PRIVATE HEALTH INSURANCE | Primary: Geriatric Medicine

## 2022-10-17 ENCOUNTER — Encounter: Admit: 2022-10-17 | Payer: PRIVATE HEALTH INSURANCE | Primary: Geriatric Medicine

## 2022-10-18 ENCOUNTER — Encounter: Admit: 2022-10-18 | Payer: PRIVATE HEALTH INSURANCE | Primary: Geriatric Medicine

## 2022-10-19 ENCOUNTER — Encounter: Admit: 2022-10-19 | Payer: PRIVATE HEALTH INSURANCE | Primary: Geriatric Medicine

## 2022-10-19 ENCOUNTER — Telehealth: Admit: 2022-10-19 | Payer: PRIVATE HEALTH INSURANCE | Attending: Family | Primary: Geriatric Medicine

## 2022-10-20 ENCOUNTER — Encounter: Admit: 2022-10-20 | Payer: PRIVATE HEALTH INSURANCE | Primary: Geriatric Medicine

## 2022-10-25 ENCOUNTER — Encounter: Admit: 2022-10-25 | Payer: Medicare (Managed Care) | Attending: Family | Primary: Geriatric Medicine

## 2022-10-26 ENCOUNTER — Encounter: Admit: 2022-10-26 | Payer: Medicare (Managed Care) | Attending: Cardiovascular Disease | Primary: Geriatric Medicine

## 2022-10-26 ENCOUNTER — Encounter: Admit: 2022-10-26 | Payer: PRIVATE HEALTH INSURANCE | Primary: Geriatric Medicine

## 2022-10-26 ENCOUNTER — Encounter: Admit: 2022-10-26 | Payer: PRIVATE HEALTH INSURANCE | Attending: Cardiovascular Disease | Primary: Geriatric Medicine

## 2022-10-26 MED ORDER — ACETAMINOPHEN 325 MG TABLET
325 | Freq: Four times a day (QID) | ORAL | 0.00 refills | 5.00000 days | Status: AC | PRN
Start: 2022-10-26 — End: ?

## 2022-10-26 MED ORDER — VALSARTAN 80 MG TABLET
80 | ORAL_TABLET | Freq: Every day | ORAL | 4 refills | 90.00000 days | Status: AC
Start: 2022-10-26 — End: 2022-11-17

## 2022-10-26 MED ORDER — METOPROLOL SUCCINATE ER 50 MG TABLET,EXTENDED RELEASE 24 HR
50 | Freq: Every day | ORAL | 4.00 refills | 90.00000 days | Status: AC
Start: 2022-10-26 — End: ?

## 2022-10-26 MED ORDER — POTASSIUM CHLORIDE ER 20 MEQ TABLET,EXTENDED RELEASE(PART/CRYST)
20 | ORAL_TABLET | Freq: Every day | ORAL | 4 refills | 30.00000 days | Status: AC
Start: 2022-10-26 — End: 2022-11-17

## 2022-10-27 ENCOUNTER — Encounter: Admit: 2022-10-27 | Payer: PRIVATE HEALTH INSURANCE | Primary: Geriatric Medicine

## 2022-10-27 ENCOUNTER — Telehealth: Admit: 2022-10-27 | Payer: PRIVATE HEALTH INSURANCE | Attending: Cardiovascular Disease | Primary: Geriatric Medicine

## 2022-10-30 ENCOUNTER — Encounter: Admit: 2022-10-30 | Payer: PRIVATE HEALTH INSURANCE | Attending: Family | Primary: Geriatric Medicine

## 2022-10-30 ENCOUNTER — Encounter: Admit: 2022-10-30 | Payer: Medicare (Managed Care) | Attending: Family | Primary: Geriatric Medicine

## 2022-11-01 ENCOUNTER — Encounter: Admit: 2022-11-01 | Payer: PRIVATE HEALTH INSURANCE | Attending: Family | Primary: Geriatric Medicine

## 2022-11-01 ENCOUNTER — Encounter: Admit: 2022-11-01 | Payer: PRIVATE HEALTH INSURANCE | Primary: Geriatric Medicine

## 2022-11-02 ENCOUNTER — Encounter: Admit: 2022-11-02 | Payer: PRIVATE HEALTH INSURANCE | Attending: Cardiovascular Disease | Primary: Geriatric Medicine

## 2022-11-02 ENCOUNTER — Encounter: Admit: 2022-11-02 | Payer: Medicare (Managed Care) | Attending: Cardiovascular Disease | Primary: Geriatric Medicine

## 2022-11-07 ENCOUNTER — Encounter: Admit: 2022-11-07 | Payer: PRIVATE HEALTH INSURANCE | Attending: Family | Primary: Geriatric Medicine

## 2022-11-07 ENCOUNTER — Encounter: Admit: 2022-11-07 | Payer: Medicare (Managed Care) | Attending: Family | Primary: Geriatric Medicine

## 2022-11-07 MED ORDER — VALSARTAN 160 MG TABLET
160 | Freq: Every day | ORAL | 4.00 refills | 90.00000 days | Status: AC
Start: 2022-11-07 — End: 2022-11-17

## 2022-11-07 MED ORDER — TORSEMIDE 20 MG TABLET
20 | Freq: Every day | ORAL | 3.00 refills | 45.00000 days | Status: AC
Start: 2022-11-07 — End: ?

## 2022-11-15 ENCOUNTER — Telehealth: Admit: 2022-11-15 | Payer: PRIVATE HEALTH INSURANCE | Primary: Geriatric Medicine

## 2022-11-17 ENCOUNTER — Encounter: Admit: 2022-11-17 | Payer: PRIVATE HEALTH INSURANCE | Attending: Cardiovascular Disease | Primary: Geriatric Medicine

## 2022-11-17 ENCOUNTER — Encounter: Admit: 2022-11-17 | Payer: Medicare (Managed Care) | Attending: Cardiovascular Disease | Primary: Geriatric Medicine

## 2022-11-17 ENCOUNTER — Telehealth: Admit: 2022-11-17 | Payer: PRIVATE HEALTH INSURANCE | Attending: Cardiovascular Disease | Primary: Geriatric Medicine

## 2022-11-17 MED ORDER — VALSARTAN 320 MG TABLET
320 | ORAL_TABLET | Freq: Every day | ORAL | 4 refills | 90.00000 days | Status: AC
Start: 2022-11-17 — End: 2023-01-23

## 2022-11-17 MED ORDER — ATORVASTATIN 20 MG TABLET
20 | Freq: Two times a day (BID) | ORAL | 4.00 refills | 90.00000 days | Status: AC
Start: 2022-11-17 — End: ?

## 2022-11-18 ENCOUNTER — Encounter: Admit: 2022-11-18 | Payer: PRIVATE HEALTH INSURANCE | Attending: Cardiovascular Disease | Primary: Geriatric Medicine

## 2022-11-20 MED ORDER — TORSEMIDE 20 MG TABLET
20 | ORAL_TABLET | ORAL | 4 refills | 45.00000 days | Status: AC
Start: 2022-11-20 — End: 2023-01-23

## 2022-12-07 ENCOUNTER — Encounter: Admit: 2022-12-07 | Payer: Medicare (Managed Care) | Attending: Family | Primary: Geriatric Medicine

## 2022-12-08 ENCOUNTER — Encounter: Admit: 2022-12-08 | Payer: Medicare (Managed Care) | Attending: Family | Primary: Geriatric Medicine

## 2022-12-12 ENCOUNTER — Encounter: Admit: 2022-12-12 | Payer: Medicare (Managed Care) | Attending: Family | Primary: Geriatric Medicine

## 2022-12-12 MED ORDER — TESSALON PERLES ORAL
ORAL | 1.00 refills | 7.00000 days | Status: AC
Start: 2022-12-12 — End: ?

## 2022-12-22 ENCOUNTER — Telehealth: Admit: 2022-12-22 | Payer: PRIVATE HEALTH INSURANCE | Attending: Cardiovascular Disease | Primary: Geriatric Medicine

## 2022-12-22 ENCOUNTER — Encounter: Admit: 2022-12-22 | Payer: PRIVATE HEALTH INSURANCE | Attending: Cardiovascular Disease | Primary: Geriatric Medicine

## 2022-12-26 ENCOUNTER — Telehealth: Admit: 2022-12-26 | Payer: PRIVATE HEALTH INSURANCE | Attending: Cardiovascular Disease | Primary: Geriatric Medicine

## 2022-12-30 ENCOUNTER — Encounter
Admit: 2022-12-30 | Payer: PRIVATE HEALTH INSURANCE | Attending: Vascular and Interventional Radiology | Primary: Geriatric Medicine

## 2022-12-31 ENCOUNTER — Encounter
Admit: 2022-12-31 | Payer: PRIVATE HEALTH INSURANCE | Attending: Vascular and Interventional Radiology | Primary: Geriatric Medicine

## 2023-01-01 ENCOUNTER — Encounter: Admit: 2023-01-01 | Payer: PRIVATE HEALTH INSURANCE | Attending: Family | Primary: Geriatric Medicine

## 2023-01-01 ENCOUNTER — Encounter: Admit: 2023-01-01 | Payer: Medicare (Managed Care) | Attending: Family | Primary: Geriatric Medicine

## 2023-01-03 ENCOUNTER — Encounter
Admit: 2023-01-03 | Payer: PRIVATE HEALTH INSURANCE | Attending: Vascular and Interventional Radiology | Primary: Geriatric Medicine

## 2023-01-04 ENCOUNTER — Encounter: Admit: 2023-01-04 | Payer: Medicare (Managed Care) | Attending: Cardiovascular Disease | Primary: Geriatric Medicine

## 2023-01-19 ENCOUNTER — Telehealth: Admit: 2023-01-19 | Payer: PRIVATE HEALTH INSURANCE | Primary: Geriatric Medicine

## 2023-01-22 ENCOUNTER — Telehealth: Admit: 2023-01-22 | Payer: PRIVATE HEALTH INSURANCE | Primary: Geriatric Medicine

## 2023-01-25 DEATH — deceased

## 2023-03-14 ENCOUNTER — Encounter: Admit: 2023-03-14 | Payer: Medicare (Managed Care) | Primary: Geriatric Medicine

## 2023-04-05 ENCOUNTER — Encounter: Admit: 2023-04-05 | Payer: Medicare (Managed Care) | Attending: Cardiovascular Disease | Primary: Geriatric Medicine

## 2023-04-25 ENCOUNTER — Encounter: Admit: 2023-04-25 | Payer: Medicare (Managed Care) | Primary: Geriatric Medicine
